# Patient Record
Sex: Male | Born: 1935
Health system: Southern US, Community
[De-identification: ages and names within clinical notes are randomized; demographics above are authoritative.]

## PROBLEM LIST (undated history)

## (undated) DIAGNOSIS — E7800 Pure hypercholesterolemia, unspecified: Secondary | ICD-10-CM

## (undated) DIAGNOSIS — I5031 Acute diastolic (congestive) heart failure: Secondary | ICD-10-CM

## (undated) DIAGNOSIS — I10 Essential (primary) hypertension: Secondary | ICD-10-CM

## (undated) HISTORY — DX: Acute diastolic (congestive) heart failure: I50.31

---

## 1999-05-08 ENCOUNTER — Emergency Department (HOSPITAL_COMMUNITY): Admission: EM | Admit: 1999-05-08 | Discharge: 1999-05-08 | Payer: Self-pay | Admitting: Emergency Medicine

## 1999-05-08 ENCOUNTER — Encounter: Payer: Self-pay | Admitting: Emergency Medicine

## 1999-06-30 ENCOUNTER — Emergency Department (HOSPITAL_COMMUNITY): Admission: EM | Admit: 1999-06-30 | Discharge: 1999-06-30 | Payer: Self-pay | Admitting: Emergency Medicine

## 2001-05-08 ENCOUNTER — Encounter: Admission: RE | Admit: 2001-05-08 | Discharge: 2001-05-08 | Payer: Self-pay | Admitting: Internal Medicine

## 2001-06-26 ENCOUNTER — Emergency Department (HOSPITAL_COMMUNITY): Admission: EM | Admit: 2001-06-26 | Discharge: 2001-06-27 | Payer: Self-pay | Admitting: Emergency Medicine

## 2007-06-30 ENCOUNTER — Emergency Department (HOSPITAL_COMMUNITY): Admission: EM | Admit: 2007-06-30 | Discharge: 2007-06-30 | Payer: Self-pay | Admitting: Emergency Medicine

## 2007-08-30 ENCOUNTER — Emergency Department (HOSPITAL_COMMUNITY): Admission: EM | Admit: 2007-08-30 | Discharge: 2007-08-30 | Payer: Self-pay | Admitting: Emergency Medicine

## 2008-11-13 ENCOUNTER — Encounter: Payer: Self-pay | Admitting: Family Medicine

## 2008-11-13 ENCOUNTER — Ambulatory Visit: Payer: Self-pay | Admitting: Family Medicine

## 2008-11-13 DIAGNOSIS — K219 Gastro-esophageal reflux disease without esophagitis: Secondary | ICD-10-CM

## 2008-11-13 DIAGNOSIS — K5909 Other constipation: Secondary | ICD-10-CM | POA: Insufficient documentation

## 2008-11-13 DIAGNOSIS — E669 Obesity, unspecified: Secondary | ICD-10-CM

## 2008-11-13 DIAGNOSIS — I10 Essential (primary) hypertension: Secondary | ICD-10-CM

## 2008-11-13 HISTORY — DX: Essential (primary) hypertension: I10

## 2008-11-13 LAB — CONVERTED CEMR LAB
ALT: 10 units/L (ref 0–53)
AST: 15 units/L (ref 0–37)
Albumin: 4.4 g/dL (ref 3.5–5.2)
Alkaline Phosphatase: 95 units/L (ref 39–117)
BUN: 12 mg/dL (ref 6–23)
CO2: 25 meq/L (ref 19–32)
Calcium: 8.9 mg/dL (ref 8.4–10.5)
Chloride: 106 meq/L (ref 96–112)
Cholesterol: 204 mg/dL — ABNORMAL HIGH (ref 0–200)
Creatinine, Ser: 1.26 mg/dL (ref 0.40–1.50)
Glucose, Bld: 133 mg/dL — ABNORMAL HIGH (ref 70–99)
HCT: 41.2 % (ref 39.0–52.0)
HDL: 34 mg/dL — ABNORMAL LOW (ref >39)
Hemoglobin: 14.4 g/dL (ref 13.0–17.0)
LDL Cholesterol: 119 mg/dL — ABNORMAL HIGH (ref 0–99)
MCHC: 35 g/dL (ref 30.0–36.0)
MCV: 94.1 fL (ref 78.0–100.0)
Platelets: 221 10*3/uL (ref 150–400)
Potassium: 4.2 meq/L (ref 3.5–5.3)
RBC: 4.38 M/uL (ref 4.22–5.81)
RDW: 12.6 % (ref 11.5–15.5)
Sodium: 142 meq/L (ref 135–145)
Total Bilirubin: 0.6 mg/dL (ref 0.3–1.2)
Total CHOL/HDL Ratio: 6
Total Protein: 7.5 g/dL (ref 6.0–8.3)
Triglycerides: 257 mg/dL — ABNORMAL HIGH (ref <150)
VLDL: 51 mg/dL — ABNORMAL HIGH (ref 0–40)
WBC: 4 10*3/uL (ref 4.0–10.5)

## 2008-11-24 ENCOUNTER — Telehealth: Payer: Self-pay | Admitting: *Deleted

## 2008-11-28 ENCOUNTER — Ambulatory Visit: Payer: Self-pay | Admitting: Family Medicine

## 2008-11-28 DIAGNOSIS — G4700 Insomnia, unspecified: Secondary | ICD-10-CM

## 2008-11-28 DIAGNOSIS — E785 Hyperlipidemia, unspecified: Secondary | ICD-10-CM

## 2008-11-28 HISTORY — DX: Hyperlipidemia, unspecified: E78.5

## 2008-11-28 LAB — CONVERTED CEMR LAB: Hgb A1c MFr Bld: 5.3 %

## 2008-12-03 ENCOUNTER — Ambulatory Visit: Payer: Self-pay | Admitting: Family Medicine

## 2008-12-09 ENCOUNTER — Telehealth: Payer: Self-pay | Admitting: Family Medicine

## 2008-12-11 ENCOUNTER — Encounter: Payer: Self-pay | Admitting: Family Medicine

## 2008-12-16 ENCOUNTER — Ambulatory Visit: Payer: Self-pay | Admitting: Family Medicine

## 2008-12-16 LAB — CONVERTED CEMR LAB
Blood in Urine, dipstick: NEGATIVE
Nitrite: NEGATIVE
Protein, U semiquant: 30
WBC Urine, dipstick: NEGATIVE

## 2008-12-22 ENCOUNTER — Telehealth: Payer: Self-pay | Admitting: Family Medicine

## 2008-12-22 ENCOUNTER — Emergency Department (HOSPITAL_COMMUNITY): Admission: EM | Admit: 2008-12-22 | Discharge: 2008-12-22 | Payer: Self-pay | Admitting: Emergency Medicine

## 2008-12-23 ENCOUNTER — Ambulatory Visit: Payer: Self-pay | Admitting: Family Medicine

## 2009-03-09 ENCOUNTER — Ambulatory Visit: Payer: Self-pay | Admitting: Family Medicine

## 2009-03-09 DIAGNOSIS — N400 Enlarged prostate without lower urinary tract symptoms: Secondary | ICD-10-CM | POA: Insufficient documentation

## 2009-03-09 DIAGNOSIS — N401 Enlarged prostate with lower urinary tract symptoms: Secondary | ICD-10-CM | POA: Insufficient documentation

## 2009-03-09 DIAGNOSIS — R32 Unspecified urinary incontinence: Secondary | ICD-10-CM

## 2009-03-09 HISTORY — DX: Benign prostatic hyperplasia without lower urinary tract symptoms: N40.0

## 2009-03-09 HISTORY — DX: Unspecified urinary incontinence: R32

## 2009-03-09 LAB — CONVERTED CEMR LAB
Bilirubin Urine: NEGATIVE
Blood in Urine, dipstick: NEGATIVE
Glucose, Urine, Semiquant: NEGATIVE
Ketones, urine, test strip: NEGATIVE
Nitrite: NEGATIVE
Protein, U semiquant: NEGATIVE
Specific Gravity, Urine: 1.025
Urobilinogen, UA: 4
WBC Urine, dipstick: NEGATIVE
pH: 6

## 2009-05-20 IMAGING — CR DG CHEST 1V PORT
1 series · 1 of 1 positions shown · non-contrast
Comparison: None

CLINICAL DATA: Chest pain

PORTABLE CHEST - 1 VIEW

[AP]
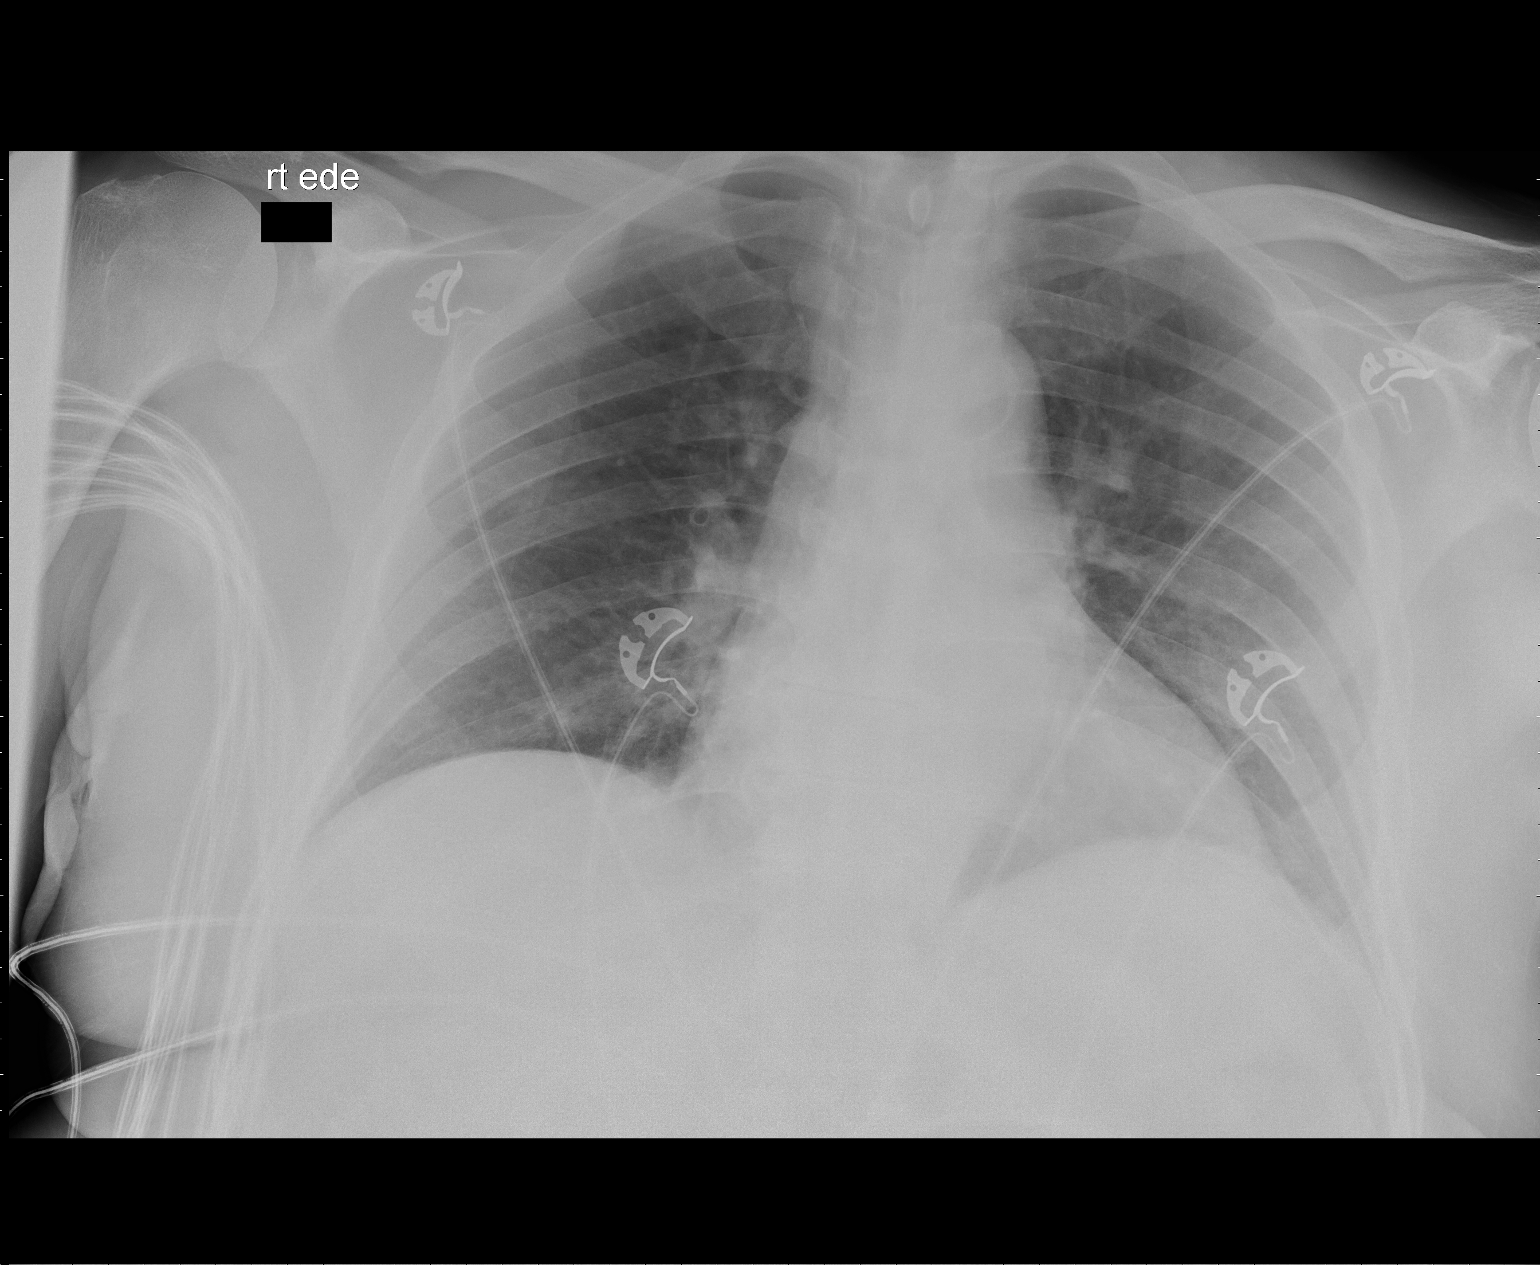

[1 of 1 positions shown; findings below may reference images not displayed]

FINDINGS: Heart size within normal limits considering AP
projection.  No congestive heart failure or pneumonia in one-view.
There is mild peribronchial thickening.
IMPRESSION: No active disease in one-view.

## 2009-07-17 ENCOUNTER — Emergency Department (HOSPITAL_COMMUNITY): Admission: EM | Admit: 2009-07-17 | Discharge: 2009-07-17 | Payer: Self-pay | Admitting: Emergency Medicine

## 2010-02-16 ENCOUNTER — Telehealth: Payer: Self-pay | Admitting: Family Medicine

## 2010-02-16 ENCOUNTER — Encounter: Admission: RE | Admit: 2010-02-16 | Discharge: 2010-02-16 | Payer: Self-pay | Admitting: Family Medicine

## 2010-02-16 ENCOUNTER — Ambulatory Visit: Payer: Self-pay | Admitting: Family Medicine

## 2010-02-16 DIAGNOSIS — M25579 Pain in unspecified ankle and joints of unspecified foot: Secondary | ICD-10-CM | POA: Insufficient documentation

## 2010-02-17 ENCOUNTER — Telehealth: Payer: Self-pay | Admitting: Family Medicine

## 2010-08-05 ENCOUNTER — Encounter: Payer: Self-pay | Admitting: *Deleted

## 2010-08-17 ENCOUNTER — Ambulatory Visit: Admission: RE | Admit: 2010-08-17 | Discharge: 2010-08-17 | Payer: Self-pay | Source: Home / Self Care

## 2010-08-17 ENCOUNTER — Encounter: Payer: Self-pay | Admitting: Family Medicine

## 2010-08-17 ENCOUNTER — Encounter
Admission: RE | Admit: 2010-08-17 | Discharge: 2010-08-17 | Payer: Self-pay | Source: Home / Self Care | Attending: Family Medicine | Admitting: Family Medicine

## 2010-08-17 DIAGNOSIS — M25559 Pain in unspecified hip: Secondary | ICD-10-CM | POA: Insufficient documentation

## 2010-08-17 DIAGNOSIS — M889 Osteitis deformans of unspecified bone: Secondary | ICD-10-CM | POA: Insufficient documentation

## 2010-08-17 LAB — CONVERTED CEMR LAB
ALT: 8 units/L (ref 0–53)
AST: 15 units/L (ref 0–37)
Albumin: 4.7 g/dL (ref 3.5–5.2)
Alkaline Phosphatase: 102 units/L (ref 39–117)
BUN: 15 mg/dL (ref 6–23)
CO2: 26 meq/L (ref 19–32)
Calcium: 9.6 mg/dL (ref 8.4–10.5)
Chloride: 105 meq/L (ref 96–112)
Creatinine, Ser: 1.23 mg/dL (ref 0.40–1.50)
Glucose, Bld: 89 mg/dL (ref 70–99)
Potassium: 4.3 meq/L (ref 3.5–5.3)
Sodium: 145 meq/L (ref 135–145)
Total Bilirubin: 0.7 mg/dL (ref 0.3–1.2)
Total Protein: 7.9 g/dL (ref 6.0–8.3)

## 2010-08-20 ENCOUNTER — Encounter: Payer: Self-pay | Admitting: *Deleted

## 2010-08-27 ENCOUNTER — Encounter (HOSPITAL_COMMUNITY)
Admission: RE | Admit: 2010-08-27 | Discharge: 2010-09-14 | Payer: Self-pay | Source: Home / Self Care | Attending: Family Medicine | Admitting: Family Medicine

## 2010-09-07 ENCOUNTER — Encounter: Payer: Self-pay | Admitting: Family Medicine

## 2010-09-07 ENCOUNTER — Telehealth: Payer: Self-pay | Admitting: Family Medicine

## 2010-09-16 NOTE — Letter (Signed)
Summary: Generic Letter  [facility]  [address]   [place], [place] [address]   Phone: [phone]  Fax: [phone]    08/20/2010  Paul [name] [address] [place], [place]  [address]  Dear [name],  I have scheduled your bone scan that Dr Earlene Plater discussed with you at your last visit. It will be Friday August 27, 2010 at 11:00am at [facility]. If you have any questions please feel free to call the office.  Also I was unable to reach you by phone, the number I have has been disconnected. If you could please call our office and give [place] an updated phone number.    Sincerely,   Rayden Dock CMA

## 2010-09-16 NOTE — Progress Notes (Signed)
Summary: pls call  Phone Note Call from Patient Call back at Home Phone [phone]   Caller: Patient Summary of Call: pt would like to talk to Dr Earlene Plater asap  Initial call taken by: De Nurse,  December 09, 2008 1:36 PM  Follow-up for Phone Call        he wanted to know what std his wife had. told him I do not discuss anyone's records with anyone but the pt. I asked him to put his wife on the phone. told her to talk with her husband, as I cannot. she had no questions about her illness or meds Follow-up by: Golden Circle RN,  December 09, 2008 2:01 PM

## 2010-09-16 NOTE — Assessment & Plan Note (Signed)
Summary: incontinence,df   Vital Signs:  Patient profile:   77 year old male Weight:      172.6 pounds Temp:     98.1 degrees F oral Pulse rate:   60 / minute BP sitting:   150 / 79  (right arm)  Vitals Entered By: Arlyss Repress CMA, (March 09, 2009 1:55 PM) CC: urinary incontinence getting worse. Is Patient Diabetic? No Pain Assessment Patient in pain? no        Primary Care Provider:  Helane Rima DO  CC:  urinary incontinence getting worse.Marland Kitchen  History of Present Illness: Paul [name] is a 77 year old male with a PMHx of HTN and BPH presenting for worsening urinary incontinence.   1. Urinary Incontinence: He was seen in April 2010 for a physical and found to have enlarged prostate on exam with no nodules noted. At that time, he c/o urinary frequency during the day, x 2 at night, trouble with begining stream, and small stream. Flomax was initiated. Since then, he has not noticed much of a difference; only easier stream. Today, he c/o urinary incontinence x 3-4 years. He states that he did not bring it up before now because he was embarrased. He has urge incontinence with sound/sight/thought of water and when he is nervous/excited, and is usually able to make it to the bathroom, but does sometimes have "leaking accidents." He does drink water throughout the day, he has no PMHx of DM, no evidence of UTI, no bowel incontinence, on no diuretics.  2. Dyspepsia: sour taste in mouth, some heartburn, taking Prilosec OTC with relief, just wanted me to know.  Current Medications (verified): 1)  Lisinopril 20 Mg Tabs (Lisinopril) .Marland Kitchen.. 1 Tablet By Mouth Daily 2)  Flomax 0.4 Mg Cp24 (Tamsulosin Hcl) .... Take 1 Capsule By Mouth Nightly 3)  Trazodone Hcl 50 Mg Tabs (Trazodone Hcl) .... Take 1/2 To One Tab At [facility] As Needed Sleep 4)  Triamcinolone Acetonide 0.1 % Crea (Triamcinolone Acetonide) .... Apply To Affected Area Bid 5)  Metronidazole 500 Mg Tabs (Metronidazole) .... Take 4 Tabs At One  Time 6)  Finasteride 5 Mg Tabs (Finasteride) .Marland Kitchen.. 1 By Mouth Daily  Allergies (verified): No Known Drug Allergies  Past History:  Past Medical History: Herpes Zoster 2008 Seasonal Allergies GERD BPH Constipation Urinary Incontinence, Urge  Review of Systems       per HPI, otherwise negative  Physical Exam  General:  Well-developed,well-nourished, in no acute distress; alert, appropriate and cooperative throughout examination. vital signs reviewed. Lungs:  Normal respiratory effort, chest expands symmetrically. Lungs are clear to auscultation, no crackles or wheezes. Heart:  Normal rate and regular rhythm. S1 and S2 normal without gallop, murmur, click, rub or other extra sounds. Abdomen:  Bowel sounds positive,abdomen soft and non-tender without masses, organomegaly or hernias noted. Genitalia:  deferred, done in April Prostate:  deferred, done in April Pulses:  R and L dorsalis pedis and posterior tibial pulses are full and equal bilaterally   Impression & Recommendations:  Problem # 1:  URINARY INCONTINENCE (ICD-788.30) Assessment New Urge type incontinence in an elderly male already on Flomax. He has had only mild improvement in his BPH symptoms. Will add Finasteride to see if this improves his BPH s/s over 3-6 months with the plan to start Detrol or similar medication if urge incontinence persists. Will send to Urology for evaluation and management recommendations. Patient voiced understanding and agreement with plan. Orders: [facility]- Est  Level 4 (04540)  Problem # 2:  BENIGN PROSTATIC HYPERTROPHY, WITH URINARY OBSTRUCTION (ICD-600.01) Assessment: Improved See #1 Orders: [facility]- Est  Level 4 (54098)  Problem # 3:  ESSENTIAL HYPERTENSION, BENIGN (ICD-401.1) Assessment: Unchanged Will not increase Lisinopril at this time since we are adding Finasteride.   His updated medication list for this problem includes:    Lisinopril 20 Mg Tabs (Lisinopril) .Marland Kitchen... 1 tablet by mouth  daily  Orders: [facility]- Est  Level 4 (11914)  Problem # 4:  GASTROESOPHAGEAL REFLUX DISEASE, MILD (ICD-530.81) Assessment: Improved Continue current treatment. Educated patient re: prevention and RED FLAGS.  Orders: [facility]- Est  Level 4 (78295)  Complete Medication List: 1)  Lisinopril 20 Mg Tabs (Lisinopril) .Marland Kitchen.. 1 tablet by mouth daily 2)  Flomax 0.4 Mg Cp24 (Tamsulosin hcl) .... Take 1 capsule by mouth nightly 3)  Trazodone Hcl 50 Mg Tabs (Trazodone hcl) .... Take 1/2 to one tab at hs as needed sleep 4)  Triamcinolone Acetonide 0.1 % Crea (Triamcinolone acetonide) .... Apply to affected area bid 5)  Metronidazole 500 Mg Tabs (Metronidazole) .... Take 4 tabs at one time 6)  Finasteride 5 Mg Tabs (Finasteride) .Marland Kitchen.. 1 by mouth daily  Other Orders: Urinalysis-[facility] (00000) Urology Referral (Urology)  Patient Instructions: 1)  It was very nice to see you today! Follow up in 2-3 months. 2)  I am prescribing another medication to help with urine leakage that may be from your prostate being enlarged. 3)  I would like to send you to the urologist for evaluation. 4)  I will not change your blood pressure medicine because the Finasteride should decrease it a little. Prescriptions: FINASTERIDE 5 MG TABS (FINASTERIDE) 1 by mouth daily  #90 x 3   Entered and Authorized by:   Helane Rima MD   Signed by:   Helane Rima MD on 03/09/2009   Method used:   Print then Give to Patient   RxID:   [id number]   Laboratory Results   Urine Tests  Date/Time Received: March 09, 2009 1:59 PM  Date/Time Reported: March 09, 2009 2:07 PM   Routine Urinalysis   Color: dark yellow Appearance: Clear Glucose: negative   (Normal Range: Negative) Bilirubin: negative   (Normal Range: Negative) Ketone: negative   (Normal Range: Negative) Spec. Gravity: 1.025   (Normal Range: 1.003-1.035) Blood: negative   (Normal Range: Negative) pH: 6.0   (Normal Range: 5.0-8.0) Protein: negative   (Normal Range:  Negative) Urobilinogen: 4.0   (Normal Range: 0-1) Nitrite: negative   (Normal Range: Negative) Leukocyte Esterace: negative   (Normal Range: Negative)    Comments: ...............test performed by......Marland KitchenBonnie A. [place], [place] (ASCP)

## 2010-09-16 NOTE — Consult Note (Signed)
[facility]   Imported By: De Nurse 12/16/2008 15:22:46  _____________________________________________________________________  External Attachment:    Type:   Image     Comment:   External Document

## 2010-09-16 NOTE — Assessment & Plan Note (Signed)
Summary: NP,df   Vital Signs:  Patient profile:   77 year old male Height:      64 inches Weight:      175.13 pounds BMI:     30.17 Temp:     97.1 degrees F oral Pulse rate:   66 / minute BP sitting:   167 / 92  (left arm)  Vitals Entered By: deseree blount/sma  Serial Vital Signs/Assessments:  Time      Position  BP       Pulse  Resp  Temp     By                     145/85                         Helane Rima MD  Comments: Taken manually, patient seated, regular cuff, by Dr. Earlene Plater By: Helane Rima MD   CC: new patient visit Is Patient Diabetic? No Pain Assessment Patient in pain? no        History of Present Illness: Paul [name] is a pleasant AA male presenting for new patient visit.  1. BP: today, elevated (1) 167/92 and (2) 145/85. Never on medication for this, no known history of HTN. Denies headache, vision changes, chest pain,  lower extremity edema. +SOB with exertion.  2. "Teeth Problems": states that he has "soft teeth," needs dentures, having trouble finding dentist.  3. Constipation: happens often, eats lots of greens, has to strain to have BM, denies rectal bleeding or pain.  4. GERD: treats with OTC medications, stable at this time.  5. BPH: has been told that he has en enlarged prostate in the past, + frequency during the day, x 2 at hs, trouble with begining stream, small stream.  6. Resp: ? Hx of TB per patient. will request records. at [facility]?  7. Health Maintenance: patient's last physical was in the 1960s, never had colonoscopy, "never" had bloodwork done for cholesterol, etc, no fluvax, unknown last tetanus vaccine, no pneumovax   Habits & Providers     Alcohol drinks/day: 0     Alcohol Counseling: not indicated; patient does not drink     Tobacco Status: never     Tobacco Counseling: not indicated; no tobacco use     STD Risk: past     Drug Use: past MJ use  Current Medications (verified): 1)  Lisinopril 10 Mg  Tabs (Lisinopril)  .... Take 1 Tab By Mouth Daily  Allergies (verified): No Known Drug Allergies  Past History:  Past Medical History:    Herpes Zoster 2008    Seasonal Allergies    GERD    BPH    Constipation  Past Surgical History:    "Boils under arms"  Family History:    Father died age 46 of unknown cancer "he had a bump on his back."    Mother died age 75 of unkown heart "complications," HTN, insulin dep DM  Social History:    Lives with wife, Paul [name]. Denies tobacco, ETOH, drugs. Wife with PMHx cocaine and ETOH abuse, Hepatitis.     Smoking Status:  never    STD Risk:  past    Drug Use:  past MJ use  Physical Exam  General:  alert, well-developed, well-nourished, well-hydrated, good hygiene, and overweight-appearing.  vital signs reviewed. Eyes:  cloudy ring around iris bilaterally. Mouth:  pharynx pink and moist, poor dentition, and teeth missing.  Lungs:  Normal respiratory effort, chest expands symmetrically. Lungs are clear to auscultation, no crackles or wheezes. Heart:  Normal rate and regular rhythm. S1 and S2 normal without gallop, murmur, click, rub or other extra sounds. Abdomen:  Bowel sounds positive,abdomen soft and non-tender without masses, organomegaly or hernias noted. Pulses:  R and L carotid, radial, dorsalis pedis and posterior tibial pulses are full and equal bilaterally Psych:  Cognition and judgment appear intact. Alert and cooperative with normal attention span and concentration. No apparent delusions, illusions, hallucinations   Impression & Recommendations:  Problem # 1:  ESSENTIAL HYPERTENSION, BENIGN (ICD-401.1) Assessment New Will begin low dose Lisinopril. Discussed advantages of decreased salt in diet and exercise in controlling this chronic disease. Will f/u one week for physical and re-eval.  His updated medication list for this problem includes:    Lisinopril 10 Mg Tabs (Lisinopril) .Marland Kitchen... Take 1 tab by mouth daily  Orders: Comp Met-[facility]  (04540-98119) CBC-[facility] (14782) Lipid-[facility] (95621-30865)  Problem # 2:  GASTROESOPHAGEAL REFLUX DISEASE, MILD (ICD-530.81) Assessment: Unchanged Mild. Stable. Continue OTC medications.   Problem # 3:  CONSTIPATION, CHRONIC (ICD-564.09) Assessment: Unchanged Discussed high fiber diet, Miralax. Will schedule screening colonoscopy.  His updated medication list for this problem includes:    Miralax Powd (Polyethylene glycol 3350) .Marland KitchenMarland KitchenMarland KitchenMarland Kitchen 17g by mouth once daily as needed constipation  Orders: Colonoscopy (Colon)  Problem # 4:  OBESITY (ICD-278.00) Assessment: Unchanged Discussed the benefits of a healthy diet and exercise.  Problem # 5:  Preventive Health Care (ICD-V70.0) Assessment: Unchanged Will have patient follow up in 1-2 weeks for full physical including rectal exam. Labs ordered and pending. Will refer for colonoscopy. Advised eye exam.  Problem # 6:  BENIGN PROSTATIC HYPERTROPHY, HX OF (ICD-V13.8) Assessment: Unchanged Full physical in one week, will include rectal exam. Will consider treating with medication.  Complete Medication List: 1)  Lisinopril 10 Mg Tabs (Lisinopril) .... Take 1 tab by mouth daily 2)  Miralax Powd (Polyethylene glycol 3350) .Marland Kitchen.. 17g by mouth once daily as needed constipation  Patient Instructions: 1)  It was a pleasure meeting you today! 2)  Please schedule a physical for 1-2 weeks.  3)  Increasing fiber in your diet may reduce or eliminate constipation. The recommended amount of dietary fiber is 20 to 35 grams of fiber per day. By reading the product information panel on the side of the package, you can determine the number of grams of fiber per serving. Many fruits and vegetables can be particularly helpful in preventing and treating constipation (table 2A-C). This is especially true of citrus fruits, prunes, and prune juice. Some breakfast cereals are also an excellent source of dietary fiber. 4)  Bulk forming laxatives - These include natural fiber  and commercial fiber preparations such as: 5)      * Psyllium (Konsyl; Metamucil; Perdiem) 6)      * Methylcellulose (Citrucel) 7)      * Calcium polycarbophil (FiberCon; Fiber-Lax; Mitrolan). 8)      * Wheat dextrin (Benefiber) 9)  You should increase the dose of fiber supplements slowly to prevent gas and cramping, and you should always take the supplement with plenty of fluid. 10)  Schedule a colonoscopy/ sigmoidoscopy to help detect colon cancer.  Prescriptions: LISINOPRIL 10 MG  TABS (LISINOPRIL) Take 1 tab by mouth daily  #30 x 0   Entered and Authorized by:   Helane Rima MD   Signed by:   Helane Rima MD on 11/13/2008   Method used:  Print then Give to Patient   RxID:   [id number]
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
## 2010-09-16 NOTE — Progress Notes (Signed)
  Phone Note Outgoing Call Call back at [facility] Phone [phone]   Call placed by: Ellery Plunk MD,  February 17, 2010 9:05 PM Summary of Call: spoke with pt to let him know that xrays normal.  leg not broken Initial call taken by: Ellery Plunk MD,  February 17, 2010 9:06 PM

## 2010-09-16 NOTE — Assessment & Plan Note (Signed)
Summary: poison ivy,tcb   Vital Signs:  Patient profile:   77 year old male Height:      63 inches Weight:      181.9 pounds BMI:     32.34 Pulse rate:   72 / minute BP sitting:   186 / 84  (right arm)  Vitals Entered By: Theresia Lo RN (December 03, 2008 1:35 PM) CC: poison ivy Is Patient Diabetic? No Pain Assessment Patient in pain? no        History of Present Illness: Paul [name] is a 77 year old AA male presenting for poison ivy on left arm. States that he was working in the yard yesterday and later, noticed that his left arm was itchy with a rash that looked like previous poison ivy.   Habits & Providers     Tobacco Status: never  Allergies (verified): No Known Drug Allergies  Review of Systems MS:  Denies joint pain, joint redness, joint swelling, loss of strength, and stiffness. Derm:  Complains of itching and rash. Allergy:  Complains of hives or rash and seasonal allergies; denies itching eyes, persistent infections, and sneezing.  Physical Exam  General:  alert, well-developed, well-nourished, well-hydrated, good hygiene, and overweight-appearing.  vital signs reviewed. Skin:  left arm rash- red, raised, no blisters, pruritic per patient, no open lesions, no streaks.   Impression & Recommendations:  Problem # 1:  CONTACT DERMATITIS-NOS (ICD-692.9) Assessment New  Rash consistent with poison ivy. Discussed natural progression of rash (blisters), future prevention, and treatment. His updated medication list for this problem includes:    Triamcinolone Acetonide 0.1 % Crea (Triamcinolone acetonide) .Marland Kitchen... Apply to affected area bid  Orders: [facility]- Est Level  3 (16109)  Problem # 2:  ESSENTIAL HYPERTENSION, BENIGN (ICD-401.1) Assessment: Deteriorated  BP elevated today. Will not adjust medicaton as patient is in distress from rash. Advised to follow up in 1-2 weeks for recheck. His updated medication list for this problem includes:    Lisinopril 20 Mg Tabs  (Lisinopril) .Marland Kitchen... 1 tablet by mouth daily  Orders: [facility]- Est Level  3 (60454)  Complete Medication List: 1)  Lisinopril 20 Mg Tabs (Lisinopril) .Marland Kitchen.. 1 tablet by mouth daily 2)  Flomax 0.4 Mg Cp24 (Tamsulosin hcl) .... Take 1 capsule by mouth nightly 3)  Trazodone Hcl 50 Mg Tabs (Trazodone hcl) .... Take 1/2 to one tab at hs as needed sleep 4)  Triamcinolone Acetonide 0.1 % Crea (Triamcinolone acetonide) .... Apply to affected area bid  Patient Instructions: 1)  "Leaves of three, let them be" is a phrase often used to identify plants that cause poison ivy dermatitis.  2)  Poison ivy dermatitis usually resolves within one to three weeks without treatment. Treatments that may help relieve the itching, soreness, and discomfort caused by poison ivy dermatitis include:  3)  -Adding oatmeal to a bath, applying cool wet compresses, and applying calamine lotion may help to relieve itching.  4)  -Once the blisters begin weeping fluid, astringents containing aluminum acetate (Burrow's solution) and Domeboro may help to relieve the rash.  5)  -Antihistamines that make you sleepy (eg, diphenhydramine [Benadryl]) may be helpful if you have trouble sleeping due to itching. 6)  Apply this steroid cream to affected area twice daily as needed for rash. Don't use on your face.  Prescriptions: TRIAMCINOLONE ACETONIDE 0.1 % CREA (TRIAMCINOLONE ACETONIDE) apply to affected area bid  #1 tube x 0   Entered and Authorized by:   Helane Rima MD   Signed  by:   Helane Rima MD on 12/03/2008   Method used:   Print then Give to Patient   RxID:   [id number]
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
## 2010-09-16 NOTE — Letter (Signed)
Summary: Generic Letter  [facility]  [address]   [place], [place] [address]   Phone: [phone]  Fax: [phone]    09/07/2010  LEOPOLD SMYERS [address] [place], [place]  [address]  Dear Mr. COLT,  I have been unable to reach you by phone to discuss the results of your bone scan. Please make an appointment to discuss the scan and treatment for your pain.  Sincerely,   Helane Rima DO  Appended Document: Generic Letter mailed

## 2010-09-16 NOTE — Progress Notes (Signed)
Summary: results  Phone Note Call from Patient Call back at [phone]   Caller: Patient Summary of Call: would like results of xray Initial call taken by: De Nurse,  September 07, 2010 10:08 AM  Follow-up for Phone Call        called patient and unable to leave message. will send letter. Follow-up by: Helane Rima DO,  September 07, 2010 12:16 PM
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
## 2010-09-17 ENCOUNTER — Ambulatory Visit (INDEPENDENT_AMBULATORY_CARE_PROVIDER_SITE_OTHER): Payer: PRIVATE HEALTH INSURANCE | Admitting: Family Medicine

## 2010-09-17 ENCOUNTER — Encounter: Payer: Self-pay | Admitting: Family Medicine

## 2010-09-17 DIAGNOSIS — M889 Osteitis deformans of unspecified bone: Secondary | ICD-10-CM

## 2010-09-17 DIAGNOSIS — M25559 Pain in unspecified hip: Secondary | ICD-10-CM

## 2010-09-20 ENCOUNTER — Encounter: Payer: Self-pay | Admitting: *Deleted

## 2010-09-20 ENCOUNTER — Encounter: Payer: Self-pay | Admitting: Family Medicine

## 2010-09-21 ENCOUNTER — Telehealth: Payer: Self-pay | Admitting: Family Medicine

## 2010-09-30 NOTE — Progress Notes (Signed)
Summary: Rx  Phone Note Call from Patient Call back at [phone]   Summary of Call: pt received our letter, he would like his meds sent to [facility] on [address].  Initial call taken by: Knox Royalty,  September 21, 2010 11:21 AM  Follow-up for Phone Call        will forward to Dr. Earlene Plater. Follow-up by: Theresia Lo RN,  September 21, 2010 11:35 AM    Prescriptions: CALCIUM CARBONATE 600 MG TABS (CALCIUM CARBONATE) one by mouth BID  #60 x 3   Entered and Authorized by:   Helane Rima DO   Signed by:   Helane Rima DO on 09/21/2010   Method used:   Electronically to        [facility] [address] #[id number]* (retail)       [address] [place], [place]  [address]       Ph: [phone]       Fax: [phone]   RxID:   [id number] VITAMIN D 1000 UNIT TABS (CHOLECALCIFEROL) one by mouth daily  #30 x 3   Entered and Authorized by:   Helane Rima DO   Signed by:   Helane Rima DO on 09/21/2010   Method used:   Electronically to        [facility] [address] #[id number]* (retail)       [address] [place], [place]  [address]       Ph: [phone]       Fax: [phone]   RxID:   [id number] FOSAMAX 40 MG TABS (ALENDRONATE SODIUM) one by mouth daily - FOR PAGET'S DISEASE  #30 x 0   Entered and Authorized by:   Helane Rima DO   Signed by:   Helane Rima DO on 09/21/2010   Method used:   Electronically to        [facility] [address] #[id number]* (retail)       [address] [place], [place]  [address]       Ph: [phone]       Fax: [phone]   RxID:   [id number]   Appended Document: Orders Update    Clinical Lists Changes  Medications: Changed medication from CALCIUM CARBONATE 600 MG TABS (CALCIUM CARBONATE) one by mouth BID to ANTACID 500 MG CHEW (CALCIUM CARBONATE ANTACID) one by mouth two times a day - Signed Rx of ANTACID 500 MG CHEW (CALCIUM CARBONATE ANTACID) one by mouth two times a day;  #60 x 3;  Signed;  Entered by:  Helane Rima DO;  Authorized by: Helane Rima DO;  Method used: Electronically to [facility] #[id number]*, [address], [place], [place]  [address], Ph: [phone], Fax: [phone]    Prescriptions: ANTACID 500 MG CHEW (CALCIUM CARBONATE ANTACID) one by mouth two times a day  #60 x 3   Entered and Authorized by:   Helane Rima DO   Signed by:   Helane Rima DO on 09/21/2010   Method used:   Electronically to        [facility] [address] #[id number]* (retail)       [address] [place], [place]  [address]       Ph: [phone]       Fax: [phone]   RxID:   [id number]

## 2010-09-30 NOTE — Letter (Signed)
Summary: Generic Letter  [facility]  [address]   [place], [place] [address]   Phone: [phone]  Fax: [phone]    09/20/2010  Paul [name] [address] [place], [place]  [address]  [place]  Dear [name],  Your recent labs were normal. I would like for you to start Fosamax daily to strengthen your bones. I would also like for you to take 1200mg  of calcium and 1000 international units of vitamin D daily. Please call the clinic so that we can send these prescriptions to your preferred pharmacy.  Sincerely,   Helane Rima DO

## 2010-09-30 NOTE — Assessment & Plan Note (Signed)
Summary: f/u xray results/[facility]c   Vital Signs:  Patient profile:   77 year old male Height:      63 inches Weight:      171 pounds BMI:     30.40 Temp:     97.5 degrees F oral Pulse rate:   81 / minute BP sitting:   144 / 74  (left arm) Cuff size:   regular  Vitals Entered By: Tessie Fass CMA (September 17, 2010 10:36 AM) CC: F/U imaging, paget's disease Is Patient Diabetic? No Pain Assessment Patient in pain? no        Primary Care Provider:  Helane Rima DO  CC:  F/U imaging and paget's disease.  History of Present Illness: 77 yo M:  1. Paget's Disease: Right proximal to mid right femoral. Hip Pain: Right side, worse when he first moves from a lying/sitting position and gets better as he "warms up." Pain is focal but "deep." CMP with normal alk phos. Tylenol with as needed Ultram controlling pain. No new s/s.   Habits & Providers  Alcohol-Tobacco-Diet     Tobacco Status: never  Current Medications (verified): 1)  Lisinopril 20 Mg Tabs (Lisinopril) .Marland Kitchen.. 1 Tablet By Mouth Daily 2)  Tramadol Hcl 50 Mg  Tabs (Tramadol Hcl) .... One By Mouth Up To Three Times A Day As Needed For Pain 3)  Tylenol Arthritis Pain 650 Mg Cr-Tabs (Acetaminophen) .... One By Mouth Tid  Allergies (verified): No Known Drug Allergies  Past History:  Past Medical History: Herpes Zoster 2008 Seasonal Allergies GERD BPH Constipation Urinary Incontinence, Urge Paget's Disease    - proximal to mid right femoral HTN PMH-FH-SH reviewed for relevance  Review of Systems General:  Denies chills, fever, weakness, and weight loss. MS:  Complains of joint pain; denies joint redness, joint swelling, and loss of strength.  Physical Exam  General:  Well-developed, well-nourished, in no acute distress; alert, appropriate and cooperative throughout examination. Vitals reviewed. Msk:  Favoring right leg. Normal low back exam with FROM, negative SLR bilaterally. Right hip TTP along joint line -  "deep inside" - + pain with internal and external rotation of hip. Pulses:  2+ DP. Extremities:  No edema. Neurologic:  Strength normal in all extremities, sensation intact to light touch, and DTRs symmetrical and normal.     Impression & Recommendations:  Problem # 1:  PAGET'S DISEASE (ICD-731.0) Assessment New Discussed Dx and Tx. Will check alk phos, phos, and vitamin D before initiating bisphosphonate. Orders: Basic Met-[facility] [phone]) Vit D, 25 OH-[facility] [phone]) Phosphorus-[facility] [phone]) [facility]- Est Level  3 (13244)  Problem # 2:  HIP PAIN, RIGHT (ICD-719.45) Assessment: Unchanged Controlled with current regimen. Denies over-sedation. His updated medication list for this problem includes:    Tramadol Hcl 50 Mg Tabs (Tramadol hcl) ..... One by mouth up to three times a day as needed for pain    Tylenol Arthritis Pain 650 Mg Cr-tabs (Acetaminophen) ..... One by mouth tid  Orders: [facility]- Est Level  3 (01027)  Complete Medication List: 1)  Lisinopril 20 Mg Tabs (Lisinopril) .Marland Kitchen.. 1 tablet by mouth daily 2)  Tramadol Hcl 50 Mg Tabs (Tramadol hcl) .... One by mouth up to three times a day as needed for pain 3)  Tylenol Arthritis Pain 650 Mg Cr-tabs (Acetaminophen) .... One by mouth tid  Patient Instructions: 1)  It was nice to see you today! 2)  We are checking labs.   Orders Added: 1)  Basic Met-[facility] [[id number]] 2)  Vit  D, 25 OH[facility] [phone] 3)  Phosphorus-[facility] [[id number]] 4)  [facility]- Est Level  3 [14782]

## 2010-11-23 LAB — POCT I-STAT, CHEM 8
BUN: 10 mg/dL (ref 6–23)
Calcium, Ion: 1.05 mmol/L — ABNORMAL LOW (ref 1.12–1.32)
Chloride: 107 meq/L (ref 96–112)
Creatinine, Ser: 1.1 mg/dL (ref 0.4–1.5)
Glucose, Bld: 93 mg/dL (ref 70–99)
HCT: 42 % (ref 39.0–52.0)
Hemoglobin: 14.3 g/dL (ref 13.0–17.0)
Potassium: 3.9 meq/L (ref 3.5–5.1)
Sodium: 140 meq/L (ref 135–145)
TCO2: 23 mmol/L (ref 0–100)

## 2010-11-23 LAB — POCT CARDIAC MARKERS
CKMB, poc: <1 ng/mL — ABNORMAL LOW (ref 1.0–8.0)
Myoglobin, poc: 65 ng/mL (ref 12–200)
Troponin i, poc: <0.05 ng/mL (ref 0.00–0.09)

## 2010-12-10 ENCOUNTER — Inpatient Hospital Stay (INDEPENDENT_AMBULATORY_CARE_PROVIDER_SITE_OTHER)
Admission: RE | Admit: 2010-12-10 | Discharge: 2010-12-10 | Disposition: A | Discharge status: Home / Self Care | Payer: PRIVATE HEALTH INSURANCE | Source: Ambulatory Visit | Attending: Family Medicine | Admitting: Family Medicine

## 2010-12-10 DIAGNOSIS — T1500XA Foreign body in cornea, unspecified eye, initial encounter: Secondary | ICD-10-CM

## 2012-02-14 ENCOUNTER — Ambulatory Visit (INDEPENDENT_AMBULATORY_CARE_PROVIDER_SITE_OTHER): Payer: PRIVATE HEALTH INSURANCE | Admitting: Family Medicine

## 2012-02-14 ENCOUNTER — Encounter: Payer: Self-pay | Admitting: Family Medicine

## 2012-02-14 VITALS — BP 165/81 | HR 59 | Ht 63.0 in | Wt 179.2 lb

## 2012-02-14 DIAGNOSIS — N529 Male erectile dysfunction, unspecified: Secondary | ICD-10-CM | POA: Insufficient documentation

## 2012-02-14 DIAGNOSIS — R109 Unspecified abdominal pain: Secondary | ICD-10-CM

## 2012-02-14 DIAGNOSIS — E785 Hyperlipidemia, unspecified: Secondary | ICD-10-CM

## 2012-02-14 DIAGNOSIS — I10 Essential (primary) hypertension: Secondary | ICD-10-CM

## 2012-02-14 DIAGNOSIS — M889 Osteitis deformans of unspecified bone: Secondary | ICD-10-CM

## 2012-02-14 MED ORDER — HYDROCHLOROTHIAZIDE 25 MG PO TABS: 25.0000 mg | ORAL_TABLET | Freq: Every day | ORAL | Status: DC

## 2012-02-14 MED ORDER — TADALAFIL 20 MG PO TABS: 10.0000 mg | ORAL_TABLET | ORAL | Status: DC | PRN

## 2012-02-14 NOTE — Assessment & Plan Note (Signed)
Will prescribe cialis. Patient denies chest pain. Upon review of records, no history of heart disease. Patient warned of side effects.

## 2012-02-14 NOTE — Patient Instructions (Addendum)
For the pain at your side, I think it is most likely due to your muscles on that side when you lay down. If it bothers you a lot, you can take 1-2 tabs of ibuprofen to help with it. If it gets any worst, please come back to the clinic.  We are going to check some blood work. Please make a lab appointment to get your lab drawn. You need to not eat or drink anything besides water after midnight the night before.  I am also prescribing a blood pressure medicine which is very important for you to take.  Please return in a month to see how your blood pressure is doing. If you have a way to check your blood pressure at home, please keep a log of how your blood pressures have been doing.   For the cialis, if you start having acute hearing loss, if you start having sudden chest pain, vision loss or feelings of dizziness, seek medical attention immediately.

## 2012-02-14 NOTE — Progress Notes (Signed)
Patient ID: Paul [name], male   DOB: 1927-07-21, 78 y.o.   MRN: [id number] Patient ID: Paul [name]    DOB: [birth year], 78 y.o.   MRN: [id number] --- Subjective:  Paul [name] is a 78 y.o.male who presents with back pain and trouble with erection. 1. Back pain:  Location: left side flank Onset: insiduous Time period of: "years ago" Severity: nagging, not acute pain. Course of symptoms over time: stable, not worst in time Aggravating: laying flat in bed Alleviating: walking, getting out of bed.  Associated sx/sn: no bowel or urinary incontinence, no weakness, no recent weight loss  2. Hypertension:  Has not been taking his lisinopril because he reports it affects his sex drive. He denies any headaches, change in vision, lower extremity edema. Doesn't check BP at home  3. Erectile dysfunction: Is not able to have erection, which is disturbing to him especially since his wife is 20 years younger.   4. Hearing loss: feels like he cannot hear as well in both ears.  5. History of paget's disease: patient's hip pain which prompted the diagnosis by bone scan, is no longer bothering him and he stopped taking cholecalciferol and fosamax.  ROS: see HPI Past Medical History: reviewed and updated medications and allergies. Social History: Tobacco: denies  Objective: Filed Vitals:   02/14/12 0932  BP: 165/81  Pulse: 59    Physical Examination:   General appearance - alert, well appearing, appears younger than stated age, and in no distress Ears - left TM normal, cerumen in right ear Nose - normal and patent, no erythema, discharge or polyps Mouth - mucous membranes moist, pharynx normal without lesions Neck - supple, no significant adenopathy Chest - clear to auscultation, no wheezes, rales or rhonchi, symmetric air entry Heart - normal rate, regular rhythm, normal S1, S2, no murmurs, rubs, clicks or gallops Abdomen - soft, nontender, nondistended, no masses or organomegaly Extremities -  peripheral pulses normal, no pedal edema, no clubbing or cyanosis Back - no tenderness along spinous processes, no tenderness along paraspinal muscles, normal flexion and extension of hips bilaterally, normal strength in lower extremities bilaterally. No rash along left flank.

## 2012-02-14 NOTE — Assessment & Plan Note (Addendum)
Currently not taking medications: fosamax and calcium. Check CMP for Alkphos. Will likely restart meds.

## 2012-02-14 NOTE — Assessment & Plan Note (Signed)
BP not controlled due to non compliance with medications. 165/81. Patient reports side effects with lisinopril. Will start him on HCTZ 25mg  daily. Follow up in 1 month. Patient to return for CMP and lipid profile when fasting.

## 2012-02-14 NOTE — Assessment & Plan Note (Signed)
Patient to come back for lab draw for fasting lipid.

## 2012-02-14 NOTE — Assessment & Plan Note (Addendum)
Non specific and chronic. Will try ibuprofen or tylenol to see if this relieves symptoms. No red flags on exam or history

## 2012-02-21 ENCOUNTER — Other Ambulatory Visit: Payer: PRIVATE HEALTH INSURANCE

## 2012-03-05 ENCOUNTER — Other Ambulatory Visit: Payer: PRIVATE HEALTH INSURANCE

## 2012-03-05 NOTE — Progress Notes (Signed)
Did not draw labs because pt was not fasting.  He is rescheduled for next Monday. Dewitt Hoes, MLS

## 2012-03-12 ENCOUNTER — Other Ambulatory Visit: Payer: PRIVATE HEALTH INSURANCE

## 2012-03-12 DIAGNOSIS — M889 Osteitis deformans of unspecified bone: Secondary | ICD-10-CM

## 2012-03-12 LAB — COMPLETE METABOLIC PANEL WITH GFR
ALT: 11 U/L (ref 0–53)
Albumin: 4.2 g/dL (ref 3.5–5.2)
Alkaline Phosphatase: 74 U/L (ref 39–117)
GFR, Est Non African American: 47 mL/min — ABNORMAL LOW
Glucose, Bld: 103 mg/dL — ABNORMAL HIGH (ref 70–99)
Potassium: 4.3 mEq/L (ref 3.5–5.3)
Sodium: 141 mEq/L (ref 135–145)
Total Protein: 6.7 g/dL (ref 6.0–8.3)

## 2012-03-12 LAB — LIPID PANEL
HDL: 31 mg/dL — ABNORMAL LOW (ref >39)
LDL Cholesterol: 148 mg/dL — ABNORMAL HIGH (ref 0–99)
Total CHOL/HDL Ratio: 6.5 Ratio
VLDL: 22 mg/dL (ref 0–40)

## 2012-03-12 NOTE — Progress Notes (Signed)
CMP AND FLP DONE TODAY Natelie Ostrosky 

## 2012-03-15 ENCOUNTER — Encounter: Payer: Self-pay | Admitting: Family Medicine

## 2012-04-23 ENCOUNTER — Ambulatory Visit (INDEPENDENT_AMBULATORY_CARE_PROVIDER_SITE_OTHER): Payer: PRIVATE HEALTH INSURANCE | Admitting: Family Medicine

## 2012-04-23 ENCOUNTER — Encounter: Payer: Self-pay | Admitting: Family Medicine

## 2012-04-23 VITALS — BP 179/74 | HR 54 | Ht 66.0 in | Wt 176.0 lb

## 2012-04-23 DIAGNOSIS — R29818 Other symptoms and signs involving the nervous system: Secondary | ICD-10-CM

## 2012-04-23 DIAGNOSIS — R2689 Other abnormalities of gait and mobility: Secondary | ICD-10-CM

## 2012-04-23 DIAGNOSIS — M889 Osteitis deformans of unspecified bone: Secondary | ICD-10-CM

## 2012-04-23 DIAGNOSIS — E785 Hyperlipidemia, unspecified: Secondary | ICD-10-CM

## 2012-04-23 DIAGNOSIS — I10 Essential (primary) hypertension: Secondary | ICD-10-CM

## 2012-04-23 MED ORDER — PRAVASTATIN SODIUM 20 MG PO TABS: 20.0000 mg | ORAL_TABLET | Freq: Every day | ORAL | Status: DC

## 2012-04-23 MED ORDER — AMLODIPINE BESYLATE 5 MG PO TABS: 5.0000 mg | ORAL_TABLET | Freq: Every day | ORAL | Status: DC

## 2012-04-23 NOTE — Patient Instructions (Addendum)
For the blood pressure, I am adding an extra medicine called amlodipine 5mg  to take every day.  I am also starting a medicine for cholesterol to take every day.  Please come back in 1 month.  If you could, come back in 1 week or so to get your blood pressure checked by a nurse here. That way I will know if the blood pressure medicine is working.   For the loss of balance, it could be an inner ear issue. If you start falling or having trouble staying steady on your feet, I would like to see you back sooner.

## 2012-04-24 DIAGNOSIS — R2689 Other abnormalities of gait and mobility: Secondary | ICD-10-CM | POA: Insufficient documentation

## 2012-04-24 NOTE — Assessment & Plan Note (Signed)
Long standing problem. Inner ear disorder vs aging process vs folate/B12 deficiency. Will check CBC at next lab draw with repeat lipid, although given his reported diet, B12 and folate deficiency seem less likely. Washed out left ear which had cerumen buildup in case this could be contributing to balance difficulty. No evidence of parkinson's at this time, no shuffling gait, no tremor. Will continue to monitor.

## 2012-04-24 NOTE — Assessment & Plan Note (Addendum)
LDL above goal <130. Patient amenable to starting medication along with diet changes. Start pravastatin 20mg  daily and increase to 40mg  if patient tolerates well. Patient to return in 4-6 weeks with repeat Lipid panel at the time

## 2012-04-24 NOTE — Assessment & Plan Note (Signed)
Alk phos normal on latest CMP. Patient not having any joint or bone pain at this time. Will hold on fosamax at this time.

## 2012-04-24 NOTE — Progress Notes (Signed)
Patient ID: Paul [name], male   DOB: 1926-10-22, 78 y.o.   MRN: [id number] --- Subjective:  Paul [name] is a 78 y.o.male with h/o HTN, Paget's disease who presents for follow up. - HTN: takes HCTZ 25mg  daily. Did not take it morning prior to appointment and sometimes misses dose. No chest pain, no shortness of breath, no leg swelling. No palpitations. Doesn't check his BP at home.  - cholesterol: had fasting lipid panel done on 03/12/12 that showed LDL:148 and HDL: 31. He states that he eats eggs, bacon, pork, beef and chicken at home.  - loss of balance: feels like his equilibrium is off when he walks. This has been persistent for the last 8 months. It doesn't happen every time he walks but does happen on a regular basis. He denies any sensation of spinning of the room, only feeling that he might loose balance. He denies any falls during these episodes. Reports ringing in the ears occasionally.   ROS: see HPI Past Medical History: reviewed and updated medications and allergies. Social History: Tobacco: denies  Objective: Filed Vitals:   04/23/12 1028  BP: 179/74  Pulse: 54    Physical Examination:   General appearance - alert, well appearing, and in no distress, walks steadily without veering, mildly increased width of gait.  Ears - cerumen present in right ear, left TM normal Mouth - mucous membranes moist, pharynx normal without lesions Neck - supple, no significant adenopathy Chest - clear to auscultation, no wheezes, rales or rhonchi, symmetric air entry Heart - normal rate, regular rhythm, normal S1, S2, no murmurs, rubs, clicks or gallops Abdomen - soft, nontender, nondistended, no masses or organomegaly Extremities - peripheral pulses normal, no pedal edema

## 2012-04-24 NOTE — Assessment & Plan Note (Addendum)
Uncontrolled at 179/74. Despite patient not taking medication in am prior to appointment and missing occasional doses, it doesn't completely explain such elevated BP. Will start amlodipine 5mg . Patient to make nurse appointment in 1 week for BP check. Follow up in 1 month

## 2012-05-08 ENCOUNTER — Encounter: Payer: Self-pay | Admitting: Family Medicine

## 2012-05-08 ENCOUNTER — Ambulatory Visit (INDEPENDENT_AMBULATORY_CARE_PROVIDER_SITE_OTHER): Payer: PRIVATE HEALTH INSURANCE | Admitting: Family Medicine

## 2012-05-08 VITALS — BP 137/81 | HR 73 | Temp 97.6°F | Ht 66.0 in | Wt 177.6 lb

## 2012-05-08 DIAGNOSIS — Z23 Encounter for immunization: Secondary | ICD-10-CM

## 2012-05-08 DIAGNOSIS — M545 Low back pain: Secondary | ICD-10-CM

## 2012-05-08 DIAGNOSIS — K625 Hemorrhage of anus and rectum: Secondary | ICD-10-CM

## 2012-05-08 DIAGNOSIS — E785 Hyperlipidemia, unspecified: Secondary | ICD-10-CM

## 2012-05-08 DIAGNOSIS — I10 Essential (primary) hypertension: Secondary | ICD-10-CM

## 2012-05-08 MED ORDER — PRAVASTATIN SODIUM 20 MG PO TABS: 20.0000 mg | ORAL_TABLET | Freq: Every day | ORAL | Status: DC

## 2012-05-08 NOTE — Assessment & Plan Note (Signed)
Improved on amlodipine 5mg . Patient doesn't think he takes any other blood pressure medicine. Continue current management

## 2012-05-08 NOTE — Assessment & Plan Note (Signed)
Elevated at last lipid check LDL: 148 on 03/12/12. Patient not currently taking pravastatin. Resent Rx for patient to take. Will then check lipid panel in 2 months.

## 2012-05-08 NOTE — Patient Instructions (Signed)
For the back pain, I'm going to order an xray. For the pain, you can take tylenol (acetaminophen) over the counter 325mg  every 6 hours for pain, no more than 3000 mg in 24 hours.

## 2012-05-08 NOTE — Progress Notes (Signed)
Patient ID: Paul [name], male   DOB: 1926/11/15, 78 y.o.   MRN: [id number] --- Subjective:  Paul [name] is a 78 y.o.male who presents for follow up on high blood pressure. Other concerns include the following: - low back mid back pain: x 2 weeks, no history of injury, worst in the morning when he wakes up. 6/10. Walking helps the pain. Pain is intermittent and doesn't happen every day. He has not taken any medicine for it. Denies weakness, no urine or bowel incontinence.  Had a history of back pain 30 years ago but hasn't had any significant pain since.  - constipation and red blood on stool: last week, ate venison and felt constipated following this. Had hard bowel movement, strained and saw red blood on stool. Took milk of magnesia which helped with the constipation. Now having soft bowel movements, Has not had red blood per rectum since. Has not had colonoscopy.  - blood pressure: has been taking amlodipine 5mg  daily. Doesn't think he has been taking Hydrochlorothiazide. Denies chest pain, shortness of breath or lower extremity swelling. Overall feels better.   ROS: see HPI Past Medical History: reviewed and updated medications and allergies. Social History: Tobacco: denies   Objective: Filed Vitals:   05/08/12 1529  BP: 137/81  Pulse: 73  Temp: 97.6 F (36.4 C)    Physical Examination:   General appearance - alert, well appearing, and in no distress Chest - clear to auscultation, no wheezes, rales or rhonchi, symmetric air entry Heart - normal rate, regular rhythm, normal S1, S2, no murmurs, rubs, clicks or gallops Abdomen - soft, nontender, nondistended, no masses or organomegaly Extremities - peripheral pulses normal, no pedal edema Back - no tenderness to palpation along cervical, thoracic or lumbar spine, negative straight leg, 5/5 strength in knee flexion, extension, plantar flexion and dorsiflexion bilaterally, sensation to light touch intact

## 2012-05-08 NOTE — Assessment & Plan Note (Signed)
No bony tenderness along lumbar spine, but given age and h/o paget's disease, would like to obtain lumbar xray to rule out any lytic lesions or any bony changes. Recommended starting with tylenol for pain control.

## 2012-05-08 NOTE — Assessment & Plan Note (Signed)
Patient had not been to his screening colonoscopy a few months ago because of being afraid of procedure. Now patient expresses wanting to get it done.  Sent in referral to GI for colonoscopy.

## 2012-06-29 ENCOUNTER — Encounter (HOSPITAL_COMMUNITY): Payer: Self-pay | Admitting: Neurology

## 2012-06-29 ENCOUNTER — Emergency Department (HOSPITAL_COMMUNITY)
Admission: EM | Admit: 2012-06-29 | Discharge: 2012-06-29 | Disposition: A | Discharge status: Home / Self Care | Payer: PRIVATE HEALTH INSURANCE | Attending: Emergency Medicine | Admitting: Emergency Medicine

## 2012-06-29 DIAGNOSIS — Z79899 Other long term (current) drug therapy: Secondary | ICD-10-CM | POA: Insufficient documentation

## 2012-06-29 DIAGNOSIS — R599 Enlarged lymph nodes, unspecified: Secondary | ICD-10-CM | POA: Insufficient documentation

## 2012-06-29 DIAGNOSIS — E7800 Pure hypercholesterolemia, unspecified: Secondary | ICD-10-CM | POA: Insufficient documentation

## 2012-06-29 DIAGNOSIS — R609 Edema, unspecified: Secondary | ICD-10-CM

## 2012-06-29 DIAGNOSIS — I10 Essential (primary) hypertension: Secondary | ICD-10-CM | POA: Insufficient documentation

## 2012-06-29 HISTORY — DX: Pure hypercholesterolemia, unspecified: E78.00

## 2012-06-29 HISTORY — DX: Essential (primary) hypertension: I10

## 2012-06-29 LAB — RAPID STREP SCREEN (MED CTR MEBANE ONLY): Streptococcus, Group A Screen (Direct): NEGATIVE

## 2012-06-29 MED ORDER — AMOXICILLIN 500 MG PO CAPS: 500.0000 mg | ORAL_CAPSULE | Freq: Three times a day (TID) | ORAL | Status: DC

## 2012-06-29 NOTE — ED Provider Notes (Signed)
Medical screening examination/treatment/procedure(s) were conducted as a shared visit with non-physician practitioner(s) and myself.  I personally evaluated the patient during the encounter Pt has swelling of left submandibular gland.  I did not feel any stone on palpating the floor of the mouth.  Rx with amoxicillin, F/U with ENT if not better.  Carleene Cooper III, MD 06/29/12 2007

## 2012-06-29 NOTE — ED Provider Notes (Signed)
History     CSN: [id number]  Arrival date & time 06/29/12  5784   First MD Initiated Contact with Patient 06/29/12 [phone]      Chief Complaint  Patient presents with  . Sore Throat    (Consider location/radiation/quality/duration/timing/severity/associated sxs/prior treatment) HPI Comments: To send 78 year old male, who presents to the emergency department with chief complaint of sore throat. Patient reports having sore throat for one week, which resolved and then came back this morning. He is concerned about a lump on the anterior surface of his neck. Patient states it is painful to swallow, but denies pain elsewhere. He has not tried taking any medications for his symptoms.  The history is provided by the patient. No language interpreter was used.    Past Medical History  Diagnosis Date  . Hypertension   . Hypercholesteremia     History reviewed. No pertinent past surgical history.  No family history on file.  History  Substance Use Topics  . Smoking status: Never Smoker   . Smokeless tobacco: Not on file  . Alcohol Use: No      Review of Systems  All other systems reviewed and are negative.    Allergies  Review of patient's allergies indicates no known allergies.  Home Medications   Current Outpatient Rx  Name  Route  Sig  Dispense  Refill  . AMLODIPINE BESYLATE 5 MG PO TABS   Oral   Take 1 tablet (5 mg total) by mouth daily.   30 tablet   3   . ALKA-SELTZER PO   Oral   Take 1 packet by mouth 2 (two) times daily as needed. For pain         . PRAVASTATIN SODIUM 20 MG PO TABS   Oral   Take 1 tablet (20 mg total) by mouth daily.   90 tablet   1     BP 114/74  Pulse 72  Temp 97.3 F (36.3 C) (Oral)  Resp 16  SpO2 98%  Physical Exam  Nursing note and vitals reviewed. Constitutional: He is oriented to person, place, and time. He appears well-developed and well-nourished.  HENT:  Head: Normocephalic and atraumatic.  Mouth/Throat:  Oropharynx is clear and moist.       Submandibular lymphadenopathy  Eyes: Conjunctivae normal and EOM are normal. Pupils are equal, round, and reactive to light.  Neck: Normal range of motion. Neck supple.  Cardiovascular: Normal rate, regular rhythm and normal heart sounds.   Pulmonary/Chest: Effort normal and breath sounds normal. No respiratory distress. He has no wheezes. He has no rales. He exhibits no tenderness.  Abdominal: Soft. Bowel sounds are normal.  Musculoskeletal: Normal range of motion.  Neurological: He is alert and oriented to person, place, and time.  Skin: Skin is warm and dry.  Psychiatric: He has a normal mood and affect. His behavior is normal. Judgment and thought content normal.    ED Course  Procedures (including critical care time)   Labs Reviewed  RAPID STREP SCREEN   Results for orders placed during the hospital encounter of 06/29/12  RAPID STREP SCREEN      Component Value Range   Streptococcus, Group A Screen (Direct) NEGATIVE  NEGATIVE        1. Submandibular gland swelling       MDM  78 year old male with sore throat and submandibular lymphadenopathy. This patient has been seen by and discussed with Dr. Ignacia Palma. I'm going to discharge the patient to home with amoxicillin. Will also  recommend ENT referral if no improvement on antibiotic. Patient is agreeable with this plan. Patient is stable and ready for discharge.        Roxy Horseman, PA-C 06/29/12 1204

## 2012-06-29 NOTE — ED Notes (Signed)
Pt reports sore throat. Painful to swallowing. Started 3 weeks ago, went away came back this morning. Airway intact. Ambulatory. A x 4

## 2012-07-03 ENCOUNTER — Ambulatory Visit: Payer: PRIVATE HEALTH INSURANCE | Admitting: Family Medicine

## 2012-11-24 ENCOUNTER — Encounter (HOSPITAL_COMMUNITY): Payer: Self-pay | Admitting: Emergency Medicine

## 2012-11-24 ENCOUNTER — Emergency Department (HOSPITAL_COMMUNITY)
Admission: EM | Admit: 2012-11-24 | Discharge: 2012-11-24 | Disposition: A | Discharge status: Home / Self Care | Payer: PRIVATE HEALTH INSURANCE | Attending: Emergency Medicine | Admitting: Emergency Medicine

## 2012-11-24 DIAGNOSIS — E7800 Pure hypercholesterolemia, unspecified: Secondary | ICD-10-CM | POA: Insufficient documentation

## 2012-11-24 DIAGNOSIS — I10 Essential (primary) hypertension: Secondary | ICD-10-CM | POA: Insufficient documentation

## 2012-11-24 DIAGNOSIS — S71109A Unspecified open wound, unspecified thigh, initial encounter: Secondary | ICD-10-CM | POA: Insufficient documentation

## 2012-11-24 DIAGNOSIS — Z7982 Long term (current) use of aspirin: Secondary | ICD-10-CM | POA: Insufficient documentation

## 2012-11-24 DIAGNOSIS — Y939 Activity, unspecified: Secondary | ICD-10-CM | POA: Insufficient documentation

## 2012-11-24 DIAGNOSIS — W268XXA Contact with other sharp object(s), not elsewhere classified, initial encounter: Secondary | ICD-10-CM | POA: Insufficient documentation

## 2012-11-24 DIAGNOSIS — S71111A Laceration without foreign body, right thigh, initial encounter: Secondary | ICD-10-CM

## 2012-11-24 DIAGNOSIS — Z23 Encounter for immunization: Secondary | ICD-10-CM | POA: Insufficient documentation

## 2012-11-24 DIAGNOSIS — Z79899 Other long term (current) drug therapy: Secondary | ICD-10-CM | POA: Insufficient documentation

## 2012-11-24 DIAGNOSIS — S71009A Unspecified open wound, unspecified hip, initial encounter: Secondary | ICD-10-CM | POA: Insufficient documentation

## 2012-11-24 DIAGNOSIS — Y929 Unspecified place or not applicable: Secondary | ICD-10-CM | POA: Insufficient documentation

## 2012-11-24 MED ORDER — TETANUS-DIPHTHERIA TOXOIDS TD 5-2 LFU IM INJ
0.5000 mL | INJECTION | Freq: Once | INTRAMUSCULAR | Status: AC
  Administered 2012-11-24: 0.5 mL via INTRAMUSCULAR
  Filled 2012-11-24: qty 0.5

## 2012-11-24 MED ORDER — OXYCODONE-ACETAMINOPHEN 5-325 MG PO TABS: 2.0000 | ORAL_TABLET | ORAL | Status: DC | PRN

## 2012-11-24 MED ORDER — CEPHALEXIN 500 MG PO CAPS: 500.0000 mg | ORAL_CAPSULE | Freq: Four times a day (QID) | ORAL | Status: DC

## 2012-11-24 NOTE — ED Notes (Signed)
Pt here s/p rt thigh in grinder, now with lg lac minimal bleeding

## 2012-11-24 NOTE — ED Provider Notes (Signed)
History     CSN: [id number]  Arrival date & time 11/24/12  0946   First MD Initiated Contact with Patient 11/24/12 1007      Chief Complaint  Patient presents with  . Extremity Laceration    (Consider location/radiation/quality/duration/timing/severity/associated sxs/prior treatment) The history is provided by the patient.   patient here after sustaining a laceration to the right distal anterior thigh yesterday with a soft. He controlled the bleeding with direct pressure but the bleeding continued today was likely present. Denies any numbness or tingling distal to the injury. Is able to flex and extend his right knee appropriate. His tetanus status is not current. Pain characterized as sharp and worse with movement.  Past Medical History  Diagnosis Date  . Hypertension   . Hypercholesteremia     History reviewed. No pertinent past surgical history.  No family history on file.  History  Substance Use Topics  . Smoking status: Never Smoker   . Smokeless tobacco: Not on file  . Alcohol Use: No      Review of Systems  All other systems reviewed and are negative.    Allergies  Review of patient's allergies indicates no known allergies.  Home Medications   Current Outpatient Rx  Name  Route  Sig  Dispense  Refill  . amLODipine (NORVASC) 5 MG tablet   Oral   Take 1 tablet (5 mg total) by mouth daily.   30 tablet   3   . amoxicillin (AMOXIL) 500 MG capsule   Oral   Take 1 capsule (500 mg total) by mouth 3 (three) times daily.   30 capsule   0   . Aspirin Effervescent (ALKA-SELTZER PO)   Oral   Take 1 packet by mouth 2 (two) times daily as needed. For pain         . pravastatin (PRAVACHOL) 20 MG tablet   Oral   Take 1 tablet (20 mg total) by mouth daily.   90 tablet   1     BP 143/59  Pulse 80  Temp(Src) 98.5 F (36.9 C)  Resp 20  SpO2 97%  Physical Exam  Nursing note and vitals reviewed. Constitutional: He is oriented to person, place, and  time. He appears well-developed and well-nourished.  Non-toxic appearance. No distress.  HENT:  Head: Normocephalic and atraumatic.  Eyes: Conjunctivae, EOM and lids are normal. Pupils are equal, round, and reactive to light.  Neck: Normal range of motion. Neck supple. No tracheal deviation present. No mass present.  Cardiovascular: Normal rate, regular rhythm and normal heart sounds.  Exam reveals no gallop.   No murmur heard. Pulmonary/Chest: Effort normal and breath sounds normal. No stridor. No respiratory distress. He has no decreased breath sounds. He has no wheezes. He has no rhonchi. He has no rales.  Abdominal: Soft. Normal appearance and bowel sounds are normal. He exhibits no distension. There is no tenderness. There is no rebound and no CVA tenderness.  Musculoskeletal: Normal range of motion. He exhibits no edema and no tenderness.       Legs: Neurological: He is alert and oriented to person, place, and time. He has normal strength. No cranial nerve deficit or sensory deficit. GCS eye subscore is 4. GCS verbal subscore is 5. GCS motor subscore is 6.  Skin: Skin is warm and dry. No abrasion and no rash noted.  Psychiatric: He has a normal mood and affect. His speech is normal and behavior is normal.    ED Course  Procedures (including critical care time)  Labs Reviewed - No data to display No results found.   No diagnosis found.    MDM  LACERATION REPAIR Performed by: Toy Baker Authorized by: Toy Baker Consent: Verbal consent obtained. Risks and benefits: risks, benefits and alternatives were discussed Consent given by: patient Patient identity confirmed: provided demographic data Prepped and Draped in normal sterile fashion Wound explored  Laceration Location: Right anterior thigh  Laceration Length: 4cm  No Foreign Bodies seen or palpated  Anesthesia: local infiltration  Local anesthetic: lidocaine 2% 1 epinephrine  Anesthetic total: 10  ml  Irrigation method: syringe Amount of cleaning: standard  Skin closure: Right anterior thigh   Number of sutures: 3   Technique: Simple with staples   Patient tolerance: Patient tolerated the procedure well with no immediate complications.  The patient's tetanus status was updated here. Wound was debrided and irrigated extensively. Staples place for loose approximation. Will place on antibiotics have patient return in 2 days for wound check. He'll also be given more specific infectious precautions to return for       Toy Baker, MD 11/24/12 1037

## 2012-11-26 ENCOUNTER — Encounter (HOSPITAL_COMMUNITY): Payer: Self-pay | Admitting: Emergency Medicine

## 2012-11-26 ENCOUNTER — Emergency Department (HOSPITAL_COMMUNITY)
Admission: EM | Admit: 2012-11-26 | Discharge: 2012-11-26 | Disposition: A | Discharge status: Home / Self Care | Payer: PRIVATE HEALTH INSURANCE | Attending: Emergency Medicine | Admitting: Emergency Medicine

## 2012-11-26 DIAGNOSIS — Z8639 Personal history of other endocrine, nutritional and metabolic disease: Secondary | ICD-10-CM | POA: Insufficient documentation

## 2012-11-26 DIAGNOSIS — Z5189 Encounter for other specified aftercare: Secondary | ICD-10-CM

## 2012-11-26 DIAGNOSIS — Z7982 Long term (current) use of aspirin: Secondary | ICD-10-CM | POA: Insufficient documentation

## 2012-11-26 DIAGNOSIS — Z862 Personal history of diseases of the blood and blood-forming organs and certain disorders involving the immune mechanism: Secondary | ICD-10-CM | POA: Insufficient documentation

## 2012-11-26 DIAGNOSIS — I10 Essential (primary) hypertension: Secondary | ICD-10-CM | POA: Insufficient documentation

## 2012-11-26 DIAGNOSIS — Z4801 Encounter for change or removal of surgical wound dressing: Secondary | ICD-10-CM | POA: Insufficient documentation

## 2012-11-26 NOTE — ED Notes (Signed)
Pt is here today to have his stables removed from his R leg, which were put in on Saturday. Pt sts pain is better. No drainage noted.

## 2012-11-26 NOTE — ED Provider Notes (Signed)
History     CSN: [id number]  Arrival date & time 11/26/12  1149   First MD Initiated Contact with Patient 11/26/12 1240      No chief complaint on file.   (Consider location/radiation/quality/duration/timing/severity/associated sxs/prior treatment) HPI Comments: Pt presents to the ED for a wound check.  Right thigh laceration repaired in the ED 11/25/11 with 3 staples.  Wound has not been swelling or draining since repair.  Normal ROM of right leg.  Has been taking keflex as directed.  Pain well controlled with percocet. Denies any new sx at this time.  The history is provided by the patient.    Past Medical History  Diagnosis Date  . Hypertension   . Hypercholesteremia     No past surgical history on file.  No family history on file.  History  Substance Use Topics  . Smoking status: Never Smoker   . Smokeless tobacco: Not on file  . Alcohol Use: No      Review of Systems  Skin: Positive for wound.  All other systems reviewed and are negative.    Allergies  Review of patient's allergies indicates no known allergies.  Home Medications   Current Outpatient Rx  Name  Route  Sig  Dispense  Refill  . aspirin EC 81 MG tablet   Oral   Take 81 mg by mouth daily.         . cephALEXin (KEFLEX) 500 MG capsule   Oral   Take 1 capsule (500 mg total) by mouth 4 (four) times daily.   20 capsule   0   . oxyCODONE-acetaminophen (PERCOCET/ROXICET) 5-325 MG per tablet   Oral   Take 2 tablets by mouth every 4 (four) hours as needed for pain.   10 tablet   0     BP 151/68  Pulse 58  Temp(Src) 97.6 F (36.4 C) (Oral)  SpO2 98%  Physical Exam  Nursing note and vitals reviewed. Constitutional: He is oriented to person, place, and time. He appears well-developed and well-nourished.  HENT:  Head: Normocephalic and atraumatic.  Eyes: Conjunctivae and EOM are normal.  Neck: Normal range of motion. Neck supple.  Cardiovascular: Normal rate, regular rhythm and  normal heart sounds.   Pulmonary/Chest: Effort normal and breath sounds normal. He has no wheezes.  Musculoskeletal: Normal range of motion. He exhibits no edema.       Legs: Right thigh laceration healing appropriately with 3 staples in place, no surrounding swelling, erythema, or signs of infection  Neurological: He is alert and oriented to person, place, and time.  Skin: Skin is warm and dry.  Psychiatric: He has a normal mood and affect.    ED Course  Procedures (including critical care time)  Labs Reviewed - No data to display No results found.   1. Visit for wound check       MDM   Pt presenting to the ED for wound check.  Wound well approximated, healing well with staples still in place.  No FB, erythema, swelling, or signs of infection.  FU with [facility] urgent care for staple removal in 4-5 days.  Continue taking abx and pain meds as directed.  Return precautions advised.       Garlon Hatchet, PA-C 11/26/12 1413

## 2012-11-26 NOTE — ED Notes (Signed)
Pt states got 3 staples in R knee Saturday, was told to come back Monday to have them removed, pt states "there falling out". Staples intact.

## 2012-11-27 NOTE — ED Provider Notes (Signed)
Medical screening examination/treatment/procedure(s) were performed by non-physician practitioner and as supervising physician I was immediately available for consultation/collaboration.   Shoaib Siefker L Kacper Cartlidge, MD 11/27/12 0748 

## 2012-12-03 ENCOUNTER — Ambulatory Visit (INDEPENDENT_AMBULATORY_CARE_PROVIDER_SITE_OTHER): Payer: PRIVATE HEALTH INSURANCE | Admitting: *Deleted

## 2012-12-03 ENCOUNTER — Encounter: Payer: Self-pay | Admitting: *Deleted

## 2012-12-03 DIAGNOSIS — Z4802 Encounter for removal of sutures: Secondary | ICD-10-CM

## 2012-12-03 NOTE — Progress Notes (Signed)
3 staples removed from upper right leg - wound clean dry and intact - Wyatt Haste, RN

## 2012-12-03 NOTE — Telephone Encounter (Signed)
This encounter was created in error - please disregard.

## 2013-05-27 ENCOUNTER — Ambulatory Visit (INDEPENDENT_AMBULATORY_CARE_PROVIDER_SITE_OTHER): Payer: PRIVATE HEALTH INSURANCE | Admitting: Family Medicine

## 2013-05-27 ENCOUNTER — Encounter: Payer: Self-pay | Admitting: Family Medicine

## 2013-05-27 ENCOUNTER — Telehealth: Payer: Self-pay | Admitting: *Deleted

## 2013-05-27 VITALS — BP 190/80 | HR 54 | Temp 97.7°F | Ht 66.0 in | Wt 179.4 lb

## 2013-05-27 DIAGNOSIS — N401 Enlarged prostate with lower urinary tract symptoms: Secondary | ICD-10-CM

## 2013-05-27 DIAGNOSIS — R0602 Shortness of breath: Secondary | ICD-10-CM

## 2013-05-27 DIAGNOSIS — N3941 Urge incontinence: Secondary | ICD-10-CM

## 2013-05-27 DIAGNOSIS — I10 Essential (primary) hypertension: Secondary | ICD-10-CM

## 2013-05-27 LAB — POCT URINALYSIS DIPSTICK
Blood, UA: NEGATIVE
Leukocytes, UA: NEGATIVE
Nitrite, UA: NEGATIVE
Protein, UA: NEGATIVE
Urobilinogen, UA: 0.2
pH, UA: 6

## 2013-05-27 LAB — COMPREHENSIVE METABOLIC PANEL
AST: 15 U/L (ref 0–37)
Alkaline Phosphatase: 83 U/L (ref 39–117)
BUN: 16 mg/dL (ref 6–23)
Creat: 1.21 mg/dL (ref 0.50–1.35)
Glucose, Bld: 76 mg/dL (ref 70–99)

## 2013-05-27 MED ORDER — AMLODIPINE BESYLATE 5 MG PO TABS: 5.0000 mg | ORAL_TABLET | Freq: Every day | ORAL | Status: DC

## 2013-05-27 MED ORDER — TAMSULOSIN HCL 0.4 MG PO CAPS: 0.4000 mg | ORAL_CAPSULE | Freq: Every day | ORAL | Status: DC

## 2013-05-27 NOTE — Patient Instructions (Signed)
Please follow up with me in 3 weeks.

## 2013-05-27 NOTE — Telephone Encounter (Signed)
Spoke with patient's daughter and gave the appointment for 2D echo appointment set for Wednesday 10/22 @ 1 pm patient needs to arrive @ 12:45pm

## 2013-05-27 NOTE — Progress Notes (Signed)
Patient ID: Calil Amor    DOB: [birth year], 79 y.o.   MRN: [id number] --- Subjective:  Fenix is a 79 y.o.male who presents for concern of urinary incontinence and shortness of breath.  - urinary incontinence: urge incontinence that has been ongoing for a couple of years, but worsening recently. Wearing adult protective underwear due to some incontinence. Urinates 5-6 times per day and 3 times per night. He has a feeling of emptying his bladder but then goes again shortly after.   - shortness of breath: has been present for the last 2 weeks especially when he starts riding his stationary bike. He feels short winded and then is able to bike through it. He has also been short winded with walking and going up stairs more than normal. He does need 2 pillows for comfort which does include breathing more easily. Denies any PND.  No lower extremity swelling.   - Hypertension: stopped taking his medication because it was causing erectile dysfunction and he didn't think he needed it. No chest pain. Some shortness of breath as above.   ROS: see HPI Past Medical History: reviewed and updated medications and allergies. Social History: Tobacco: none  Objective: Filed Vitals:   05/27/13 0918  BP: 190/80  Pulse: 54  Temp: 97.7 F (36.5 C)    Physical Examination:   General appearance - alert, well appearing, and in no distress Chest - clear to auscultation, no wheezes, rales or rhonchi, symmetric air entry Heart - bradycardia, normal rhythm, no murmur  Abdomen - soft, nontender, nondistended, no masses or organomegaly Extremities - trace pitting edema

## 2013-05-28 DIAGNOSIS — R0602 Shortness of breath: Secondary | ICD-10-CM | POA: Insufficient documentation

## 2013-05-28 NOTE — Assessment & Plan Note (Signed)
New onset shortness of breath. Appears to have some orthopnea.  Will obtain echo to rule out cardiac etiology.

## 2013-05-28 NOTE — Assessment & Plan Note (Addendum)
Elevated due to non compliance of medicine. Restart amlodipine 5mg  daily and flomax added as well which will have a minimal effect on BP. Obtain CMP.  F/u 3 weeks

## 2013-05-28 NOTE — Assessment & Plan Note (Signed)
Patient denies any h/o BPH although it is part of his problem list.  - obtain PSA and will therefore defer prostate exam to avoid falsely elevated value - start flomax  - UA - BMP

## 2013-05-31 ENCOUNTER — Telehealth: Payer: Self-pay | Admitting: Family Medicine

## 2013-05-31 NOTE — Telephone Encounter (Signed)
Called patient and let him know lab results were normal.   [name], PGY-3 Family Medicine Resident

## 2013-06-05 ENCOUNTER — Ambulatory Visit (HOSPITAL_COMMUNITY)
Admission: RE | Admit: 2013-06-05 | Discharge: 2013-06-05 | Disposition: A | Discharge status: Home / Self Care | Payer: PRIVATE HEALTH INSURANCE | Source: Ambulatory Visit | Attending: Family Medicine | Admitting: Family Medicine

## 2013-06-05 DIAGNOSIS — I10 Essential (primary) hypertension: Secondary | ICD-10-CM

## 2013-06-05 DIAGNOSIS — I517 Cardiomegaly: Secondary | ICD-10-CM

## 2013-06-05 DIAGNOSIS — R0602 Shortness of breath: Secondary | ICD-10-CM | POA: Insufficient documentation

## 2013-06-06 ENCOUNTER — Telehealth: Payer: Self-pay | Admitting: Family Medicine

## 2013-06-06 NOTE — Telephone Encounter (Signed)
Called patient and let him know of the results of his 2D echo. Recommended that he follow up with me in 2 weeks   [name], PGY-3  Family Medicine Resident

## 2013-06-06 NOTE — Telephone Encounter (Signed)
Will fwd to MD for review.  Izaan Kingbird L, CMA  

## 2013-06-06 NOTE — Telephone Encounter (Signed)
Pt called and would like to know what his results are of his Echo on 10/22. He would like Dr. Gwenlyn Saran to call him as soon as possible. JW

## 2013-06-06 NOTE — Progress Notes (Signed)
Called patient and let him know of the results of his 2D echo. Recommended that he follow up with me in 2 weeks   Paul [name], PGY-3  Family Medicine Resident  

## 2013-07-09 ENCOUNTER — Encounter: Payer: Self-pay | Admitting: Sports Medicine

## 2013-07-09 ENCOUNTER — Ambulatory Visit (INDEPENDENT_AMBULATORY_CARE_PROVIDER_SITE_OTHER): Payer: PRIVATE HEALTH INSURANCE | Admitting: Sports Medicine

## 2013-07-09 VITALS — BP 165/78 | HR 81 | Temp 98.2°F | Ht 66.0 in | Wt 184.0 lb

## 2013-07-09 DIAGNOSIS — I5031 Acute diastolic (congestive) heart failure: Secondary | ICD-10-CM

## 2013-07-09 DIAGNOSIS — I10 Essential (primary) hypertension: Secondary | ICD-10-CM

## 2013-07-09 HISTORY — DX: Acute diastolic (congestive) heart failure: I50.31

## 2013-07-09 MED ORDER — FUROSEMIDE 20 MG PO TABS: 20.0000 mg | ORAL_TABLET | Freq: Every day | ORAL | Status: DC

## 2013-07-09 NOTE — Progress Notes (Signed)
  Paul [name] - 79 y.o. male MRN [id number]  Date of birth: 11/08/1926  CC, HPI, INTERVAL HISTORY & ROS  [name] is here today for same day appointment due to abdominal distention.      He reports 1 month progressive weight gain, abdominal distention, lower extremity edema, bendopnea, decreased exercise tolerance.    Pt denies chest pain, dyspnea at rest or exertion, PND, lower extremity edema.  Patient denies any facial asymmetry, unilateral weakness, or dysarthria.  Recent echocardiogram showed diastolic dysfunction with preserved ejection fraction.  History  Past Medical, Surgical, Social, and Family History Reviewed per EMR Medications and Allergies reviewed and all updated if necessary. Objective Findings  VITALS: HR: 81 bpm  BP: 165/78 mmHg  TEMP: 98.2 F (36.8 C) (Oral)  RESP:    HT: 5\' 6"  (167.6 cm)  WT: 184 lb (83.462 kg)  BMI: 29.8   BP Readings from Last 3 Encounters:  07/09/13 165/78  05/27/13 190/80  11/26/12 151/68   Wt Readings from Last 3 Encounters:  07/09/13 184 lb (83.462 kg)  05/27/13 179 lb 6.4 oz (81.375 kg)  05/08/12 177 lb 9.6 oz (80.559 kg)     PHYSICAL EXAM: GENERAL:  elderly African American male. In no discomfort; no respiratory distress  PSYCH: alert and appropriate, good insight   HNEENT:  prominent carotid pulsation, positive hepatojugular reflux   CARDIO: RRR, S1/S2 heard, no murmur, no S3   LUNGS:  by basilar crackles   ABDOMEN:  distended, no fluid wave   EXTREM:  Warm, well perfused.  Moves all 4 extremities spontaneously; no lateralization.  1Distal pulses +/4.  2+ pretibial edema.  GU:   SKIN:     Assessment & Plan   Problems addressed today: General Plan & Pt Instructions:  1. Acute diastolic heart failure   2. Essential hypertension, benign       Start Lasix daily  Weight yourself every morning       For further discussion of A/P and for follow up issues see problem based charting if applicable.

## 2013-07-09 NOTE — Assessment & Plan Note (Signed)
BMET today for Cr prior to Lasix, check proBNP for baseline Lasix added  Follow up with Dr. Gwenlyn Saran

## 2013-07-09 NOTE — Patient Instructions (Signed)
   Start Lasix daily  Weight yourself every morning     If you need anything prior to your next visit please call the clinic. Please Bring all medications or accurate medication list with you to each appointment; an accurate medication list is essential in providing you the best care possible.    Here are some basic nutrition rules to remember:  "Eat Real Foods & Drink Real Drinks" - if you think it was made in a factory . . it is likely best to avoid it as a staple in your diet.  Limiting these types of foods to 1-2 times per week is a good idea.  Sticking with fresh fruits and vegetables as well as home cooked meals will typically provide more nutrition and less salt than prepackaged meals.     Limit the amount of sugar sweetened and artificially sweetened foods and beverages.  Sticking with water flavored with a slice of lemon, lime or orange is a great option if you want something with flavor in it.  Using flavored seltzer water to flavor plain water will also add some bite if you want something more than flavor.  Avoid soda, juices and generally any bottled beverage is a good idea.     Eat at least 3 meals and 1-2 snacks per day.  Aim for no more than 5 hours between eating.   Here are 2 of my favorite web sites that provide great nutrition and exercise advice.   www.eats[place]movemore[place].com www.choosemyplate.gov

## 2013-07-10 LAB — BASIC METABOLIC PANEL
BUN: 20 mg/dL (ref 6–23)
CO2: 18 mEq/L — ABNORMAL LOW (ref 19–32)
Calcium: 9.2 mg/dL (ref 8.4–10.5)
Creat: 1.41 mg/dL — ABNORMAL HIGH (ref 0.50–1.35)
Glucose, Bld: 91 mg/dL (ref 70–99)
Potassium: 3.9 mEq/L (ref 3.5–5.3)
Sodium: 139 mEq/L (ref 135–145)

## 2013-07-10 NOTE — Assessment & Plan Note (Addendum)
Add Lasix, likely fluid overload contributing to a pressure Continue TLC (Therapeutic Lifestyle Changes) >50% of this 25 minute visit spent in direct patient counseling and/or coordination of care.

## 2013-09-23 ENCOUNTER — Other Ambulatory Visit: Payer: Self-pay | Admitting: Family Medicine

## 2014-01-14 ENCOUNTER — Telehealth: Payer: Self-pay | Admitting: Family Medicine

## 2014-01-23 ENCOUNTER — Telehealth: Payer: Self-pay | Admitting: Family Medicine

## 2014-01-23 NOTE — Telephone Encounter (Signed)
Attempted to call patient, he does not have voicemail. If he calls back please ask him what medication he is taking for acid reflux as we do not have that in his record.[name], Paul Medin

## 2014-01-23 NOTE — Telephone Encounter (Signed)
Needs refill on acid reflux medicine Pharmacy: [facility] on [address] and [address]

## 2014-01-23 NOTE — Telephone Encounter (Signed)
Please ask patient what medication he would like, because it is not listed on his medication list in our system. Thank you!  [name], PGY-3 Family Medicine Resident

## 2014-01-23 NOTE — Telephone Encounter (Signed)
Forward to PCP for refill.Busick, Arno Lee  

## 2014-01-24 NOTE — Telephone Encounter (Signed)
Attempted to call patient again, no voicemail. If he returns call, please read message below.Paul [name], Paul [name]

## 2014-01-28 NOTE — Telephone Encounter (Signed)
Patient never returned call.Busick, Matilde Lee  

## 2014-01-29 NOTE — Telephone Encounter (Signed)
Tried calling patient again to see what reflux medicine he needs, but no answer and no option to leave message.  Marena ChancyStephanie Shelitha Magley, PGY-3 Family Medicine Resident

## 2014-04-25 ENCOUNTER — Encounter: Payer: Self-pay | Admitting: Family Medicine

## 2014-04-25 ENCOUNTER — Ambulatory Visit (INDEPENDENT_AMBULATORY_CARE_PROVIDER_SITE_OTHER): Payer: PRIVATE HEALTH INSURANCE | Admitting: Family Medicine

## 2014-04-25 VITALS — BP 202/80 | HR 51 | Temp 98.1°F | Wt 177.0 lb

## 2014-04-25 DIAGNOSIS — N401 Enlarged prostate with lower urinary tract symptoms: Secondary | ICD-10-CM

## 2014-04-25 DIAGNOSIS — I10 Essential (primary) hypertension: Secondary | ICD-10-CM

## 2014-04-25 DIAGNOSIS — N138 Other obstructive and reflux uropathy: Secondary | ICD-10-CM

## 2014-04-25 DIAGNOSIS — N400 Enlarged prostate without lower urinary tract symptoms: Secondary | ICD-10-CM

## 2014-04-25 DIAGNOSIS — I5031 Acute diastolic (congestive) heart failure: Secondary | ICD-10-CM

## 2014-04-25 MED ORDER — FUROSEMIDE 20 MG PO TABS: 20.0000 mg | ORAL_TABLET | Freq: Every day | ORAL | Status: DC

## 2014-04-25 MED ORDER — ASPIRIN EC 81 MG PO TBEC: 81.0000 mg | DELAYED_RELEASE_TABLET | Freq: Every day | ORAL | Status: DC

## 2014-04-25 MED ORDER — TAMSULOSIN HCL 0.4 MG PO CAPS: ORAL_CAPSULE | ORAL | Status: DC

## 2014-04-25 MED ORDER — AMLODIPINE BESYLATE 5 MG PO TABS: 5.0000 mg | ORAL_TABLET | Freq: Every day | ORAL | Status: DC

## 2014-04-25 NOTE — Patient Instructions (Signed)
I have refilled all of your medications. I would like to see you back for a blood pressure check next week so that we can be sure your blood pressure is better on your medicine.

## 2014-05-04 NOTE — Assessment & Plan Note (Addendum)
Asymptomatic, volume status normal - refilled lasix - continue ASA

## 2014-05-04 NOTE — Assessment & Plan Note (Signed)
Refilled flomax

## 2014-05-04 NOTE — Progress Notes (Signed)
   Subjective:    Patient ID: Paul [name], male    DOB: 1927/03/25, 80 y.o.   MRN: [id number]  HPI Pt presents for f/u of heart failure and hypertension. He has been feeling well but worried about his heart. No complaints. Out of meds for over a week so did not take this am.   Review of Systems  Constitutional: Negative for fever, chills, diaphoresis, fatigue and unexpected weight change.  Respiratory: Negative for shortness of breath.   Cardiovascular: Negative for chest pain, palpitations and leg swelling.  Gastrointestinal: Negative for nausea, vomiting, abdominal pain and diarrhea.  Neurological: Negative for dizziness, syncope, light-headedness and headaches.  All other systems reviewed and are negative.      Objective:   Physical Exam  Nursing note and vitals reviewed. Constitutional: He is oriented to person, place, and time. He appears well-developed and well-nourished. No distress.  HENT:  Head: Normocephalic and atraumatic.  Eyes: Conjunctivae are normal. Right eye exhibits no discharge. Left eye exhibits no discharge. No scleral icterus.  Cardiovascular: Normal rate, regular rhythm, normal heart sounds and intact distal pulses.   No murmur heard. Pulmonary/Chest: Effort normal and breath sounds normal. No respiratory distress. He has no wheezes.  Abdominal: Soft. Bowel sounds are normal. He exhibits no distension. There is no tenderness.  Musculoskeletal: He exhibits no edema.  Neurological: He is alert and oriented to person, place, and time.  Skin: Skin is warm and dry. No rash noted. He is not diaphoretic. No pallor.  Psychiatric: He has a normal mood and affect. His behavior is normal.          Assessment & Plan:

## 2014-05-04 NOTE — Assessment & Plan Note (Addendum)
Uncontrolled today but out of meds for >1 week, recheck improved at 175/90 - refill amlodipine - f/u in 1 week for bp check

## 2014-08-05 ENCOUNTER — Emergency Department (HOSPITAL_COMMUNITY): Payer: PRIVATE HEALTH INSURANCE

## 2014-08-05 ENCOUNTER — Encounter (HOSPITAL_COMMUNITY): Payer: Self-pay | Admitting: Cardiology

## 2014-08-05 ENCOUNTER — Emergency Department (HOSPITAL_COMMUNITY)
Admission: EM | Admit: 2014-08-05 | Discharge: 2014-08-05 | Disposition: A | Discharge status: Home / Self Care | Payer: PRIVATE HEALTH INSURANCE | Attending: Emergency Medicine | Admitting: Emergency Medicine

## 2014-08-05 DIAGNOSIS — I10 Essential (primary) hypertension: Secondary | ICD-10-CM | POA: Diagnosis not present

## 2014-08-05 DIAGNOSIS — J189 Pneumonia, unspecified organism: Secondary | ICD-10-CM

## 2014-08-05 DIAGNOSIS — Z79899 Other long term (current) drug therapy: Secondary | ICD-10-CM | POA: Insufficient documentation

## 2014-08-05 DIAGNOSIS — J159 Unspecified bacterial pneumonia: Secondary | ICD-10-CM | POA: Diagnosis not present

## 2014-08-05 DIAGNOSIS — Z7982 Long term (current) use of aspirin: Secondary | ICD-10-CM | POA: Diagnosis not present

## 2014-08-05 DIAGNOSIS — Z8639 Personal history of other endocrine, nutritional and metabolic disease: Secondary | ICD-10-CM | POA: Diagnosis not present

## 2014-08-05 DIAGNOSIS — R0981 Nasal congestion: Secondary | ICD-10-CM | POA: Diagnosis present

## 2014-08-05 MED ORDER — LEVOFLOXACIN 750 MG PO TABS: 750.0000 mg | ORAL_TABLET | Freq: Every day | ORAL | Status: DC

## 2014-08-05 MED ORDER — ALBUTEROL SULFATE HFA 108 (90 BASE) MCG/ACT IN AERS: 2.0000 | INHALATION_SPRAY | RESPIRATORY_TRACT | Status: DC | PRN

## 2014-08-05 MED ORDER — HYDROCODONE-HOMATROPINE 5-1.5 MG/5ML PO SYRP: 5.0000 mL | ORAL_SOLUTION | Freq: Four times a day (QID) | ORAL | Status: DC | PRN

## 2014-08-05 MED ORDER — GUAIFENESIN 100 MG/5ML PO LIQD: 100.0000 mg | ORAL | Status: DC | PRN

## 2014-08-05 NOTE — Discharge Instructions (Signed)

## 2014-08-05 NOTE — ED Notes (Addendum)
Pt reports a cough for the past 2 weeks. Reports that he has not had any fever, but reports left rib pain. States that he has been told that he had a problem with his heart pumping right in the past, but is on no medication.

## 2014-08-05 NOTE — ED Provider Notes (Signed)
CSN: [id number]     Arrival date & time 08/05/14  1438 History   First MD Initiated Contact with Patient 08/05/14 1705     Chief Complaint  Patient presents with  . Cough  . Nasal Congestion     (Consider location/radiation/quality/duration/timing/severity/associated sxs/prior Treatment) HPI Comments: Patient presents to the ER for evaluation of cough and chest congestion that has been ongoing for 2 weeks. He reports that the symptoms seemed to improve over the course of the day, but when he wakes up he has significantly worsened cough and mucus production. He has not had any significant shortness of breath. He has not noticed any fever.  Patient is a 80 y.o. male presenting with cough.  Cough Associated symptoms: no shortness of breath     Past Medical History  Diagnosis Date  . Hypertension   . Hypercholesteremia    History reviewed. No pertinent past surgical history. History reviewed. No pertinent family history. History  Substance Use Topics  . Smoking status: Never Smoker   . Smokeless tobacco: Not on file  . Alcohol Use: No    Review of Systems  Respiratory: Positive for cough. Negative for shortness of breath.   All other systems reviewed and are negative.     Allergies  Review of patient's allergies indicates no known allergies.  Home Medications   Prior to Admission medications   Medication Sig Start Date End Date Taking? Authorizing Provider  albuterol (PROVENTIL HFA;VENTOLIN HFA) 108 (90 BASE) MCG/ACT inhaler Inhale 2 puffs into the lungs every 4 (four) hours as needed for wheezing or shortness of breath. 08/05/14   Gilda Creasehristopher J. Atiba Kimberlin, MD  amLODipine (NORVASC) 5 MG tablet Take 1 tablet (5 mg total) by mouth daily. 04/25/14   Abram SanderElena M Adamo, MD  aspirin EC 81 MG tablet Take 1 tablet (81 mg total) by mouth daily. 04/25/14   Abram SanderElena M Adamo, MD  furosemide (LASIX) 20 MG tablet Take 1 tablet (20 mg total) by mouth daily. 04/25/14   Abram SanderElena M Adamo, MD   guaiFENesin (ROBITUSSIN) 100 MG/5ML liquid Take 5-10 mLs (100-200 mg total) by mouth every 4 (four) hours as needed for cough. 08/05/14   Gilda Creasehristopher J. Mykenzie Ebanks, MD  HYDROcodone-homatropine [facility](HYCODAN) 5-1.5 MG/5ML syrup Take 5 mLs by mouth every 6 (six) hours as needed for cough. 08/05/14   Gilda Creasehristopher J. Luceal Hollibaugh, MD  levofloxacin (LEVAQUIN) 750 MG tablet Take 1 tablet (750 mg total) by mouth daily. 08/05/14   Gilda Creasehristopher J. Gawain Crombie, MD  tamsulosin (FLOMAX) 0.4 MG CAPS capsule TAKE ONE CAPSULE BY MOUTH DAILY 04/25/14   Abram SanderElena M Adamo, MD   BP 169/82 mmHg  Pulse 100  Temp(Src) 98.1 F (36.7 C) (Oral)  Resp 22  Ht 5\' 7"  (1.702 m)  Wt 177 lb (80.287 kg)  BMI 27.72 kg/m2  SpO2 98% Physical Exam  Constitutional: He is oriented to person, place, and time. He appears well-developed and well-nourished. No distress.  HENT:  Head: Normocephalic and atraumatic.  Right Ear: Hearing normal.  Left Ear: Hearing normal.  Nose: Nose normal.  Mouth/Throat: Oropharynx is clear and moist and mucous membranes are normal.  Eyes: Conjunctivae and EOM are normal. Pupils are equal, round, and reactive to light.  Neck: Normal range of motion. Neck supple.  Cardiovascular: Regular rhythm, S1 normal and S2 normal.  Exam reveals no gallop and no friction rub.   No murmur heard. Pulmonary/Chest: Effort normal and breath sounds normal. No respiratory distress. He exhibits no tenderness.  Abdominal: Soft. Normal appearance and  bowel sounds are normal. There is no hepatosplenomegaly. There is no tenderness. There is no rebound, no guarding, no tenderness at McBurney's point and negative Murphy's sign. No hernia.  Musculoskeletal: Normal range of motion.  Neurological: He is alert and oriented to person, place, and time. He has normal strength. No cranial nerve deficit or sensory deficit. Coordination normal. GCS eye subscore is 4. GCS verbal subscore is 5. GCS motor subscore is 6.  Skin: Skin is warm, dry and intact. No  rash noted. No cyanosis.  Psychiatric: He has a normal mood and affect. His speech is normal and behavior is normal. Thought content normal.  Nursing note and vitals reviewed.   ED Course  Procedures (including critical care time) Labs Review Labs Reviewed - No data to display  Imaging Review Dg Chest 2 View (if Patient Has Fever And/or Copd)  08/05/2014   CLINICAL DATA:  Cough for 2 weeks, history hypertension  EXAM: CHEST  2 VIEW  COMPARISON:  12/22/2008  FINDINGS: Normal heart size and pulmonary vascularity.  Calcified tortuous thoracic aorta.  New RIGHT upper lobe infiltrate consistent with pneumonia.  Chronic bronchitic changes with minimal RIGHT basilar atelectasis.  No pleural effusion or pneumothorax.  Bones unremarkable.  IMPRESSION: Chronic bronchitic changes with new RIGHT upper lobe infiltrate consistent with pneumonia.  Followup until resolution recommended to exclude underlying abnormalities.   Electronically Signed   By: Ulyses SouthwardMark  Boles M.D.   On: 08/05/2014 17:08     EKG Interpretation None      MDM   Final diagnoses:  CAP (community acquired pneumonia)   Patient presents to the ER for evaluation of cough that has been ongoing for 2 weeks. Chest x-ray shows evidence of pneumonia. Patient is in no distress. Oxygenation is 97-98% on room air. He is breathing comfortably. Vital signs are all normal. He doesn't require hospitalization for this, is appropriate for outpatient management. He was given strict return precautions for worsening symptoms.    Gilda Creasehristopher J. Daveyon Kitchings, MD 08/05/14 1726

## 2014-08-05 NOTE — ED Notes (Signed)
Pt states that congestion, cough, and drainage are worse in the morning after he wakes up.

## 2014-12-08 ENCOUNTER — Ambulatory Visit (INDEPENDENT_AMBULATORY_CARE_PROVIDER_SITE_OTHER): Payer: Medicare Other | Admitting: Family Medicine

## 2014-12-08 ENCOUNTER — Encounter: Payer: Self-pay | Admitting: Family Medicine

## 2014-12-08 VITALS — BP 161/89 | HR 51 | Temp 97.8°F | Ht 67.0 in | Wt 172.5 lb

## 2014-12-08 DIAGNOSIS — T1591XA Foreign body on external eye, part unspecified, right eye, initial encounter: Secondary | ICD-10-CM | POA: Diagnosis not present

## 2014-12-08 DIAGNOSIS — T1501XA Foreign body in cornea, right eye, initial encounter: Secondary | ICD-10-CM | POA: Diagnosis not present

## 2014-12-08 DIAGNOSIS — T1590XA Foreign body on external eye, part unspecified, unspecified eye, initial encounter: Secondary | ICD-10-CM | POA: Insufficient documentation

## 2014-12-08 NOTE — Patient Instructions (Signed)
Eye, Foreign Body A foreign body is an object that should not be there. The object could be near, on, or in the eye. HOME CARE  Keep the eye closed as much as possible.  Do not rub the eye.  Wear dark glasses in bright light.  Do not wear contact lenses until the eye feels normal, or as told by your doctor.  Wear protective eye covering, especially when using high speed tools.  Only take medicine as told by your doctor.  GET HELP RIGHT AWAY IF:   Your pain gets worse.  Your vision changes.  You have problems with the eye patch.  The injury gets larger.  There is fluid (discharge) coming from the eye.  You get puffiness (swelling) and soreness.  You have an oral temperature above 102 F (38.9 C), not controlled by medicine.  MAKE SURE YOU:   Understand these instructions.  Will watch your condition.  Will get help right away if you are not doing well or get worse. Document Released: 01/19/2010 Document Revised: 10/24/2011 Document Reviewed: 12/27/2012 [facility] Patient Information 2015 [place] [place]. This information is not intended to replace advice given to you by your health care provider. Make sure you discuss any questions you have with your health care provider.

## 2014-12-08 NOTE — Progress Notes (Signed)
   Subjective:    Patient ID: Paul [name], male    DOB: 03/05/1927, 81 y.o.   MRN: [id number]  HPI Pt presents for foreign body in his right eye for 3 days. 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 He hoped it would come out on its own but it has continued to become more painful. It now has some redness and discharge. No fever, purulence, changes in vision, pain with EOM, not light sensitive.   Review of Systems See HPI    Objective:   Physical Exam  Constitutional: He is oriented to person, place, and time. He appears well-developed and well-nourished. No distress.  HENT:  Head: Normocephalic and atraumatic.  Eyes: EOM are normal. Pupils are equal, round, and reactive to light. Right eye exhibits discharge. Foreign body present in the right eye. Left eye exhibits no discharge. Right conjunctiva is injected. Left conjunctiva is not injected. No scleral icterus.    Cardiovascular: Normal rate.   No murmur heard. Pulmonary/Chest: Effort normal. No respiratory distress.  Abdominal: He exhibits no distension.  Neurological: He is alert and oriented to person, place, and time.  Skin: Skin is warm and dry. No rash noted. He is not diaphoretic.  Psychiatric: He has a normal mood and affect. His behavior is normal.  Nursing note and vitals reviewed.         Assessment & Plan:

## 2014-12-08 NOTE — Assessment & Plan Note (Signed)
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

## 2014-12-09 NOTE — Progress Notes (Signed)
Patient scheduled for urgent eye appt today at 10:00 am to check for foreign body.  Does not drive and uses bus for transportation.  Due to short notice and patient's limited availability to get to eye appt, will provide patient with taxi voucher for [id number] [address]  Copy of voucher placed in [employer] box.  Altamese Dilling~Garald Rhew, BSN, RN-BC

## 2015-02-19 ENCOUNTER — Encounter: Payer: Self-pay | Admitting: Family Medicine

## 2015-02-19 ENCOUNTER — Ambulatory Visit (INDEPENDENT_AMBULATORY_CARE_PROVIDER_SITE_OTHER): Payer: Medicare Other | Admitting: Family Medicine

## 2015-02-19 VITALS — BP 135/59 | HR 77 | Temp 98.6°F | Wt 171.6 lb

## 2015-02-19 DIAGNOSIS — R109 Unspecified abdominal pain: Secondary | ICD-10-CM

## 2015-02-19 NOTE — Patient Instructions (Signed)
Thanks for coming in today!   I think that your feeling is most likely from gas.   You should eat 2-3 oranges each day to help your bowels move well.   Thanks for letting us take care of you!   Sincerely,  Devota Pacealeb Lemmie Vanlanen, MD Family Medicine - PGY 2

## 2015-02-23 NOTE — Progress Notes (Signed)
Patient ID: Paul KelpRobert [name], male   DOB: 08/06/1927, [age] y.o.   MRN: [id number]   [facility] Yolande Jollyaleb G Sheehan Stacey, MD Phone: [phone]  Subjective:   # Left Sided Bubbling Feeling - Pt. Here with complaint of "odd bubbling" on left side.  - He is "very in tune with his body".  - He says that he has had an intermittent feeling of gurgling on the left side over the past two weeks.  - No pain, nausea, vomiting, fever, chills, sweats, weight loss, melena, diarrhea, hematochezia.  - He has not had any change in his bowel habits.  - He eats an excellent diet with lots of fiber.  - He has not had any reflux.  - He denies chest pain, SOB, arm pain, neck pain, jaw pain.  - He has not had any trauma to the area.  - He has not noticed any burning sensation or rashes.  - Colon screening up to date.   All relevant systems were reviewed and were negative unless otherwise noted in the HPI  Past Medical History Reviewed problem list.  Medications- reviewed and updated Current Outpatient Prescriptions  Medication Sig Dispense Refill  . albuterol (PROVENTIL HFA;VENTOLIN HFA) 108 (90 BASE) MCG/ACT inhaler Inhale 2 puffs into the lungs every 4 (four) hours as needed for wheezing or shortness of breath. 1 Inhaler 0  . amLODipine (NORVASC) 5 MG tablet Take 1 tablet (5 mg total) by mouth daily. 90 tablet 3  . aspirin EC 81 MG tablet Take 1 tablet (81 mg total) by mouth daily. 90 tablet 4  . furosemide (LASIX) 20 MG tablet Take 1 tablet (20 mg total) by mouth daily. 90 tablet 4  . guaiFENesin (ROBITUSSIN) 100 MG/5ML liquid Take 5-10 mLs (100-200 mg total) by mouth every 4 (four) hours as needed for cough. 60 mL 0  . HYDROcodone-homatropine (HYCODAN) 5-1.5 MG/5ML syrup Take 5 mLs by mouth every 6 (six) hours as needed for cough. 75 mL 0  . levofloxacin (LEVAQUIN) 750 MG tablet Take 1 tablet (750 mg total) by mouth daily. 5 tablet 0  . tamsulosin (FLOMAX) 0.4 MG CAPS capsule TAKE ONE CAPSULE  BY MOUTH DAILY 90 capsule 4   No current facility-administered medications for this visit.   Chief complaint-noted No additions to family history Social history- patient is a non smoker  Objective: BP 135/59 mmHg  Pulse 77  Temp(Src) 98.6 F (37 C)  Wt 171 lb 9.6 oz (77.837 kg) Gen: NAD, alert, cooperative with exam HEENT: NCAT, EOMI, PERRL Neck: FROM, supple CV: RRR, good S1/S2, no murmur Resp: CTABL, no wheezes, non-labored Abd: SNTND, BS present, no guarding or organomegaly, No left sided tenderness. No splenomegaly. Some slightly increased BS on the left. Otherwise no masses palpable.  Ext: No edema, warm, normal tone, moves UE/LE spontaneously Neuro: Alert and oriented, No gross deficits Skin: no rashes no lesions  Assessment/Plan:  # Delayed Bowel Transit - Pt. With likely variation in bowel function at this time. He has no history or exam concerning for MI, Zoster, or obstruction. He has an excellent diet and his colon screening is up to date.  - Follow up prn - Continue to use high fiber diet.  - If symptoms persist or worsen return for evaluation or to the ED.  - Follow up with PCP as previously scheduled.

## 2015-02-25 ENCOUNTER — Telehealth: Payer: Self-pay | Admitting: Family Medicine

## 2015-02-25 NOTE — Telephone Encounter (Signed)
Left message on voicemail for patient to return call with more details.

## 2015-02-25 NOTE — Telephone Encounter (Signed)
Pt is still having the same problem as last yr and it is not getting beter Would like to have an xray or CT scan to see if there is a tumor there Please advise

## 2015-02-26 NOTE — Telephone Encounter (Signed)
Left another message for patient to call back 

## 2015-02-27 NOTE — Telephone Encounter (Signed)
Left message, will await callback from patient.

## 2015-12-14 ENCOUNTER — Ambulatory Visit (INDEPENDENT_AMBULATORY_CARE_PROVIDER_SITE_OTHER): Payer: Medicare Other | Admitting: Family Medicine

## 2015-12-14 VITALS — BP 179/59 | HR 57 | Temp 98.5°F | Wt 166.4 lb

## 2015-12-14 DIAGNOSIS — I10 Essential (primary) hypertension: Secondary | ICD-10-CM

## 2015-12-14 DIAGNOSIS — R059 Cough, unspecified: Secondary | ICD-10-CM

## 2015-12-14 DIAGNOSIS — R05 Cough: Secondary | ICD-10-CM | POA: Insufficient documentation

## 2015-12-14 MED ORDER — AMLODIPINE BESYLATE 5 MG PO TABS: 5.0000 mg | ORAL_TABLET | Freq: Every day | ORAL | Status: DC

## 2015-12-14 NOTE — Assessment & Plan Note (Signed)
Likely related to viral URI vs allergic rhinitis with postnasal drip Reassured patient, discussed symptomatic management, natural time course, and return precautions F/u prn

## 2015-12-14 NOTE — Progress Notes (Signed)
   Subjective:   Paul [name] is a 82 y.o. male with a history of HFpEF, BPH, HLD, Obesity, GERD here for same day appt for cough  COUGH  Has been coughing for 3 days. Cough is: deep and productive Sputum production: light yellow Medications tried: none  Taking blood pressure medications: no, supposed to be taking amlodipine 5mg  but ran out >2 months ago  Symptoms Runny nose: yes, thin clear Mucous in back of throat: yes Sore throat initially Throat burning or reflux: no Wheezing or asthma: no Fever: subjective warmth at bedtime, self-resolves Chest Pain: no Shortness of breath: no Leg swelling: no Hemoptysis: no Weight loss: no  Wife diagnosed by ED with "light PNA" somewhat recently - he doesn't know any details because she doesn't tell him anything  Review of Systems:  Per HPI.   Social History: never smoker - passive smoke exposure  Objective:  BP 173/59 mmHg  Pulse 57  Temp(Src) 98.5 F (36.9 C) (Oral)  Wt 166 lb 6.4 oz (75.479 kg)  SpO2 97%  Gen:  82 y.o. male in NAD HEENT: NCAT, MMM, EOMI, PERRL, anicteric sclerae, OP clear, TMs clear b/l CV: RRR, no MRG Resp: Non-labored, CTAB, no wheezes noted Ext: WWP, no edema MSK: No obvious deformities Neuro: Alert and oriented, speech normal    Assessment & Plan:     Paul KelpRobert [name] is a 82 y.o. male here for  Essential hypertension, benign Poorly controlled Out of medications for >2 months Asymptomatic Restart amlodipine 5mg  daily F/u with PCP in 2-4 weeks  Cough Likely related to viral URI vs allergic rhinitis with postnasal drip Reassured patient, discussed symptomatic management, natural time course, and return precautions F/u prn    Erasmo DownerAngela M Bacigalupo, MD MPH PGY-2,  [facility] 12/14/2015  9:54 AM

## 2015-12-14 NOTE — Assessment & Plan Note (Signed)
Poorly controlled Out of medications for >2 months Asymptomatic Restart amlodipine 5mg  daily F/u with PCP in 2-4 weeks

## 2015-12-14 NOTE — Patient Instructions (Signed)
I think your cough is related to cold or allergies.  Try nasal saline to clear out nasal mucous.  I refilled your blood pressure pills, so start taking those again.  Follow-up with PCP in 2-4 weeks for blood pressure.  Take care, Dr. BLeonard Schwartz

## 2016-01-08 ENCOUNTER — Ambulatory Visit: Payer: Medicare Other | Admitting: Family Medicine

## 2016-03-18 ENCOUNTER — Other Ambulatory Visit: Payer: Self-pay | Admitting: Family Medicine

## 2016-03-18 DIAGNOSIS — I10 Essential (primary) hypertension: Secondary | ICD-10-CM

## 2016-05-03 ENCOUNTER — Encounter (HOSPITAL_COMMUNITY): Payer: Self-pay | Admitting: *Deleted

## 2016-05-03 ENCOUNTER — Emergency Department (HOSPITAL_COMMUNITY): Payer: Medicare Other

## 2016-05-03 ENCOUNTER — Emergency Department (HOSPITAL_COMMUNITY)
Admission: EM | Admit: 2016-05-03 | Discharge: 2016-05-03 | Disposition: A | Discharge status: Home / Self Care | Payer: Medicare Other | Attending: Emergency Medicine | Admitting: Emergency Medicine

## 2016-05-03 DIAGNOSIS — Y929 Unspecified place or not applicable: Secondary | ICD-10-CM | POA: Insufficient documentation

## 2016-05-03 DIAGNOSIS — R2241 Localized swelling, mass and lump, right lower limb: Secondary | ICD-10-CM | POA: Diagnosis not present

## 2016-05-03 DIAGNOSIS — Y939 Activity, unspecified: Secondary | ICD-10-CM | POA: Insufficient documentation

## 2016-05-03 DIAGNOSIS — I5031 Acute diastolic (congestive) heart failure: Secondary | ICD-10-CM | POA: Insufficient documentation

## 2016-05-03 DIAGNOSIS — Z7982 Long term (current) use of aspirin: Secondary | ICD-10-CM | POA: Insufficient documentation

## 2016-05-03 DIAGNOSIS — M25561 Pain in right knee: Secondary | ICD-10-CM | POA: Diagnosis not present

## 2016-05-03 DIAGNOSIS — Y999 Unspecified external cause status: Secondary | ICD-10-CM | POA: Diagnosis not present

## 2016-05-03 DIAGNOSIS — W228XXA Striking against or struck by other objects, initial encounter: Secondary | ICD-10-CM | POA: Diagnosis not present

## 2016-05-03 DIAGNOSIS — I110 Hypertensive heart disease with heart failure: Secondary | ICD-10-CM | POA: Insufficient documentation

## 2016-05-03 DIAGNOSIS — L989 Disorder of the skin and subcutaneous tissue, unspecified: Secondary | ICD-10-CM | POA: Diagnosis present

## 2016-05-03 NOTE — ED Notes (Signed)
Patient transported to X-ray 

## 2016-05-03 NOTE — ED Triage Notes (Signed)
Pt states injured one week ago to right lower leg and has healed over and concerned for something in his leg

## 2016-05-03 NOTE — Discharge Instructions (Signed)
Read the information below.  Your x-ray was negative for obvious foreign body, dislocation, fracture. I provided the contact information for orthopedic surgery above. Please call to schedule an appointment for further evaluation. If you experience pain you can take Tylenol or Motrin for relief. Any time you come to the emergency department it is recommended that you follow up with your primary care provider. Please call to schedule an appointment. You may return to the Emergency Department at any time for worsening condition or any new symptoms that concern you. Return to the ED if you develop fever, tenderness at knot, lower leg pain and swelling, chest pain, shortness of breath.

## 2016-05-03 NOTE — ED Provider Notes (Signed)
[facility]-EMERGENCY DEPT Provider Note   CSN: [id number] Arrival date & time: 05/03/16  1642  By signing my name below, I, Paul [name], attest that this documentation has been prepared under the direction and in the presence of non-physician practitioner, Arvilla MeresAshley Rollen Selders, PA-C. Electronically Signed: Nelwyn SalisburyJoshua [name], Scribe. 05/05/2016. 2:58 PM.    History   Chief Complaint Chief Complaint  Patient presents with  . Leg Injury   The history is provided by the patient. No language interpreter was used.     HPI Comments:  Claud KelpRobert [name] is a 82 y.o. male with PMHx of HTN who presents to the Emergency Department complaining of sudden-onset resolved right lower leg injury occurring a week ago. Pt states that he was mowing the lawn when a piece of metal bounced off a nearby surface and embedded in his leg. He states that he thought the metal fell out of his leg when he put the bandage on, and is concerned for a potential blood clot. He denies any warmth, swelling, or redness. He denies any recent long distance travel, surgery or hospitalization, hemoptysis, vomiting, leg swelling/pain, CP, fever, joint swelling or SOB. When asked about his PMHx he states he has no history of blood clots or cancer.   Past Medical History:  Diagnosis Date  . Hypercholesteremia   . Hypertension     Patient Active Problem List   Diagnosis Date Noted  . Cough 12/14/2015  . Eye foreign body 12/08/2014  . Acute diastolic heart failure (HCC) 07/09/2013  . Shortness of breath 05/28/2013  . Bright red blood per rectum 05/08/2012  . Low back pain 05/08/2012  . Balance problem 04/24/2012  . Flank pain 02/14/2012  . Erectile dysfunction 02/14/2012  . HIP PAIN, RIGHT 08/17/2010  . PAGET'S DISEASE 08/17/2010  . ANKLE PAIN, LEFT 02/16/2010  . BENIGN PROSTATIC HYPERTROPHY, WITH URINARY OBSTRUCTION 03/09/2009  . URINARY INCONTINENCE 03/09/2009  . HYPERLIPIDEMIA 11/28/2008  . INSOMNIA 11/28/2008  . OBESITY 11/13/2008  .  Essential hypertension, benign 11/13/2008  . GASTROESOPHAGEAL REFLUX DISEASE, MILD 11/13/2008  . CONSTIPATION, CHRONIC 11/13/2008    History reviewed. No pertinent surgical history.     Home Medications    Prior to Admission medications   Medication Sig Start Date End Date Taking? Authorizing Provider  albuterol (PROVENTIL HFA;VENTOLIN HFA) 108 (90 BASE) MCG/ACT inhaler Inhale 2 puffs into the lungs every 4 (four) hours as needed for wheezing or shortness of breath. 08/05/14   Gilda Creasehristopher J Pollina, MD  amLODipine (NORVASC) 5 MG tablet TAKE 1 TABLET BY MOUTH DAILY 03/18/16   Casey BurkittHillary Moen Fitzgerald, MD  aspirin EC 81 MG tablet Take 1 tablet (81 mg total) by mouth daily. 04/25/14   Abram SanderElena M Adamo, MD  furosemide (LASIX) 20 MG tablet Take 1 tablet (20 mg total) by mouth daily. 04/25/14   Abram SanderElena M Adamo, MD  guaiFENesin (ROBITUSSIN) 100 MG/5ML liquid Take 5-10 mLs (100-200 mg total) by mouth every 4 (four) hours as needed for cough. 08/05/14   Gilda Creasehristopher J Pollina, MD  HYDROcodone-homatropine [facility](HYCODAN) 5-1.5 MG/5ML syrup Take 5 mLs by mouth every 6 (six) hours as needed for cough. 08/05/14   Gilda Creasehristopher J Pollina, MD  levofloxacin (LEVAQUIN) 750 MG tablet Take 1 tablet (750 mg total) by mouth daily. 08/05/14   Gilda Creasehristopher J Pollina, MD  tamsulosin (FLOMAX) 0.4 MG CAPS capsule TAKE ONE CAPSULE BY MOUTH DAILY 04/25/14   Abram SanderElena M Adamo, MD    Family History No family history on file.  Social History Social History  Substance  Use Topics  . Smoking status: Never Smoker  . Smokeless tobacco: Never Used  . Alcohol use No     Allergies   Review of patient's allergies indicates no known allergies.   Review of Systems Review of Systems  Constitutional: Negative for fever.  Respiratory: Negative for shortness of breath.   Cardiovascular: Negative for chest pain and leg swelling.       No hemoptysis  Gastrointestinal: Negative for vomiting.  Musculoskeletal: Negative for arthralgias and  joint swelling.  Skin: Positive for wound. Negative for color change.     Physical Exam Updated Vital Signs BP 151/66 (BP Location: Right Arm)   Pulse (!) 52   Temp 97.6 F (36.4 C) (Oral)   Resp 18   Ht 5\' 5"  (1.651 m)   Wt 81.6 kg   SpO2 98%   BMI 29.95 kg/m   Physical Exam  Constitutional: He is oriented to person, place, and time. He appears well-developed and well-nourished. No distress.  HENT:  Head: Normocephalic and atraumatic.  Eyes: Conjunctivae are normal. No scleral icterus.  Neck: Normal range of motion.  Cardiovascular: Normal rate, regular rhythm, normal heart sounds and intact distal pulses.   No murmur heard. Pulmonary/Chest: Effort normal and breath sounds normal. No respiratory distress. He has no wheezes. He has no rales.  Abdominal: He exhibits no distension.  Musculoskeletal:  Smooth, firm, mobile mass medial and just below right knee. Well healing wound noted in area. Non-tender. No warmth, erythema appreciated. No TTP, swelling, effusion, warmth, erythema of knee appreciated.  No TTP of posterior calf. No palpable cords. Negative homan's. Strength and sensation intact. Distal pulses intact. Patient is ambulatory.  Neurological: He is alert and oriented to person, place, and time.  Skin: Skin is warm and dry. He is not diaphoretic.  Psychiatric: He has a normal mood and affect. His behavior is normal.  Nursing note and vitals reviewed.    ED Treatments / Results  Labs (all labs ordered are listed, but only abnormal results are displayed) Labs Reviewed - No data to display  EKG  EKG Interpretation None       Radiology Dg Knee Complete 4 Views Right  Result Date: 05/03/2016 CLINICAL DATA:  Painful knot in the medial aspect of the knee. Foreign object struck knee 1 week ago while mowing. EXAM: RIGHT KNEE - COMPLETE 4+ VIEW COMPARISON:  None. FINDINGS: No joint effusion. There is extensive regional vascular calcification. No evidence of  osteoarthritis. No evidence of fracture. No evidence of soft tissue foreign object. IMPRESSION: Negative except for vascular calcification. No specific finding in the medial knee to correlate with the clinical history. Electronically Signed   By: Paulina Fusi M.D.   On: 05/03/2016 19:46    Procedures Procedures (including critical care time)  Medications Ordered in ED Medications - No data to display   Initial Impression / Assessment and Plan / ED Course  I have reviewed the triage vital signs and the nursing notes.  Pertinent labs & imaging results that were available during my care of the patient were reviewed by me and considered in my medical decision making (see chart for details).  Clinical Course  Value Comment By Time  DG Knee Complete 4 Views Right No evidence of fracture or dislocation.  Lona Kettle, [place] 09/19 2000   Patient presents to ED with complaint of knot to right lower extremity. Patient is afebrile and non-toxic appearing in NAD. VSS. Physical Exam remarkable for smooth, firm, mobile mass  medial just below right knee. No tenderness, warmth, erythema appreciated. No tenderness palpation, swelling, effusion, warmth, erythema of the knee. Patient is ambulatory. Sensation and pulses intact. Well's score 0, low suspicion for DVT. X-ray negative for foreign body, dislocation, fracture. Of note, vascular calcification. Discussed results and plan with patient. Will have patient follow up with surgery for further evaluation of possible organic foreign body not detected on x-ray. Return precautions provided. Patient voiced understanding and is agreeable.  Discussed pt with Dr. Dalene Seltzer, who also saw patient, agrees with plan.   Final Clinical Impressions(s) / ED Diagnoses   Final diagnoses:  Lower leg mass, right   I personally performed the services described in this documentation, which was scribed in my presence. The recorded information has been reviewed and is  accurate.  New Prescriptions Discharge Medication List as of 05/03/2016  8:18 PM       Lona Kettle, PA-C 05/05/16 1458    Alvira Monday, MD 05/08/16 2117    Alvira Monday, MD 05/08/16 2122

## 2016-05-07 ENCOUNTER — Encounter (HOSPITAL_COMMUNITY): Payer: Self-pay

## 2016-05-07 ENCOUNTER — Emergency Department (HOSPITAL_BASED_OUTPATIENT_CLINIC_OR_DEPARTMENT_OTHER)
Admission: RE | Admit: 2016-05-07 | Discharge: 2016-05-07 | Disposition: A | Discharge status: Home / Self Care | Payer: Medicare Other | Source: Ambulatory Visit | Attending: Emergency Medicine | Admitting: Emergency Medicine

## 2016-05-07 ENCOUNTER — Emergency Department (HOSPITAL_COMMUNITY)
Admission: EM | Admit: 2016-05-07 | Discharge: 2016-05-07 | Disposition: A | Discharge status: Home / Self Care | Payer: Medicare Other | Attending: Emergency Medicine | Admitting: Emergency Medicine

## 2016-05-07 DIAGNOSIS — I10 Essential (primary) hypertension: Secondary | ICD-10-CM | POA: Diagnosis not present

## 2016-05-07 DIAGNOSIS — Z7982 Long term (current) use of aspirin: Secondary | ICD-10-CM | POA: Diagnosis not present

## 2016-05-07 DIAGNOSIS — M79609 Pain in unspecified limb: Secondary | ICD-10-CM

## 2016-05-07 DIAGNOSIS — L02419 Cutaneous abscess of limb, unspecified: Secondary | ICD-10-CM

## 2016-05-07 DIAGNOSIS — M65061 Abscess of tendon sheath, right lower leg: Secondary | ICD-10-CM | POA: Insufficient documentation

## 2016-05-07 DIAGNOSIS — M79604 Pain in right leg: Secondary | ICD-10-CM | POA: Diagnosis present

## 2016-05-07 DIAGNOSIS — L02415 Cutaneous abscess of right lower limb: Secondary | ICD-10-CM | POA: Diagnosis not present

## 2016-05-07 DIAGNOSIS — Z79899 Other long term (current) drug therapy: Secondary | ICD-10-CM | POA: Diagnosis not present

## 2016-05-07 MED ORDER — SULFAMETHOXAZOLE-TRIMETHOPRIM 800-160 MG PO TABS: 1.0000 | ORAL_TABLET | Freq: Two times a day (BID) | ORAL | 0 refills | Status: AC

## 2016-05-07 MED ORDER — CEPHALEXIN 500 MG PO CAPS: 500.0000 mg | ORAL_CAPSULE | Freq: Four times a day (QID) | ORAL | 0 refills | Status: DC

## 2016-05-07 MED ORDER — LIDOCAINE HCL (PF) 1 % IJ SOLN
30.0000 mL | Freq: Once | INTRAMUSCULAR | Status: DC
  Filled 2016-05-07: qty 30

## 2016-05-07 NOTE — Progress Notes (Signed)
VASCULAR LAB PRELIMINARY  PRELIMINARY  PRELIMINARY  PRELIMINARY  Right lower extremity venous duplex completed.    Preliminary report:  There is no DVT or SVT noted in the right lower extremity.   Cale Decarolis, RVT 05/07/2016, 12:53 PM

## 2016-05-07 NOTE — Discharge Instructions (Signed)
Take the antibiotics as prescribed. Follow up to have your wound checked again in 2 days. Return to the ED if you develop new or worsening symptoms.

## 2016-05-07 NOTE — ED Provider Notes (Signed)
[facility]-EMERGENCY DEPT Provider Note   CSN: [id number] Arrival date & time: 05/07/16  1056     History   Chief Complaint Chief Complaint  Patient presents with  . Leg Pain    HPI Paul [name] is a 82 y.o. male.  Patient complains of ongoing right medial leg pain after being struck by a foreign object in the morning his lawn last week. He was seen in the ED had a negative x-ray on 9/19 for foreign body. He reports ongoing pain and swelling larger. Denies any fever, vomiting, bleeding or drainage. Denies chest pain or shortness of breath.   The history is provided by the patient.  Leg Pain      Past Medical History:  Diagnosis Date  . Hypercholesteremia   . Hypertension     Patient Active Problem List   Diagnosis Date Noted  . Cough 12/14/2015  . Eye foreign body 12/08/2014  . Acute diastolic heart failure (HCC) 07/09/2013  . Shortness of breath 05/28/2013  . Bright red blood per rectum 05/08/2012  . Low back pain 05/08/2012  . Balance problem 04/24/2012  . Flank pain 02/14/2012  . Erectile dysfunction 02/14/2012  . HIP PAIN, RIGHT 08/17/2010  . PAGET'S DISEASE 08/17/2010  . ANKLE PAIN, LEFT 02/16/2010  . BENIGN PROSTATIC HYPERTROPHY, WITH URINARY OBSTRUCTION 03/09/2009  . URINARY INCONTINENCE 03/09/2009  . HYPERLIPIDEMIA 11/28/2008  . INSOMNIA 11/28/2008  . OBESITY 11/13/2008  . Essential hypertension, benign 11/13/2008  . GASTROESOPHAGEAL REFLUX DISEASE, MILD 11/13/2008  . CONSTIPATION, CHRONIC 11/13/2008    History reviewed. No pertinent surgical history.     Home Medications    Prior to Admission medications   Medication Sig Start Date End Date Taking? Authorizing Provider  albuterol (PROVENTIL HFA;VENTOLIN HFA) 108 (90 BASE) MCG/ACT inhaler Inhale 2 puffs into the lungs every 4 (four) hours as needed for wheezing or shortness of breath. 08/05/14   [name], MD  amLODipine (NORVASC) 5 MG tablet TAKE 1 TABLET BY MOUTH DAILY 03/18/16   [name], MD  aspirin EC 81 MG tablet Take 1 tablet (81 mg total) by mouth daily. 04/25/14   [name], MD  furosemide (LASIX) 20 MG tablet Take 1 tablet (20 mg total) by mouth daily. 04/25/14   [name], MD  guaiFENesin (ROBITUSSIN) 100 MG/5ML liquid Take 5-10 mLs (100-200 mg total) by mouth every 4 (four) hours as needed for cough. 08/05/14   [name], MD  HYDROcodone-homatropine [facility]) 5-1.5 MG/5ML syrup Take 5 mLs by mouth every 6 (six) hours as needed for cough. 08/05/14   [name], MD  levofloxacin (LEVAQUIN) 750 MG tablet Take 1 tablet (750 mg total) by mouth daily. 08/05/14   [name], MD  tamsulosin (FLOMAX) 0.4 MG CAPS capsule TAKE ONE CAPSULE BY MOUTH DAILY 04/25/14   [name], MD    Family History No family history on file.  Social History Social History  Substance Use Topics  . Smoking status: Never Smoker  . Smokeless tobacco: Never Used  . Alcohol use No     Allergies   Review of patient's allergies indicates no known allergies.   Review of Systems Review of Systems  Constitutional: Negative for activity change.  Respiratory: Negative for shortness of breath and wheezing.   Cardiovascular: Negative for chest pain.  Gastrointestinal: Negative for abdominal pain, nausea and vomiting.  Genitourinary: Negative for dysuria and hematuria.  Musculoskeletal: Positive for joint swelling. Negative for arthralgias and myalgias.  Neurological:  Negative for dizziness, weakness, light-headedness and headaches.   A complete 10 system review of systems was obtained and all systems are negative except as noted in the HPI and PMH.    Physical Exam Updated Vital Signs BP 137/69 (BP Location: Right Arm)   Pulse 65   Temp 97.7 F (36.5 C) (Oral)   Resp 16   SpO2 98%   Physical Exam  Constitutional: He is oriented to person, place, and time. He appears well-developed and well-nourished. No distress.  HENT:  Head:  Normocephalic and atraumatic.  Mouth/Throat: Oropharynx is clear and moist. No oropharyngeal exudate.  Eyes: Conjunctivae and EOM are normal. Pupils are equal, round, and reactive to light.  Neck: Normal range of motion. Neck supple.  No meningismus.  Cardiovascular: Normal rate, regular rhythm, normal heart sounds and intact distal pulses.   No murmur heard. Pulmonary/Chest: Effort normal and breath sounds normal. No respiratory distress.  Abdominal: Soft. There is no tenderness. There is no rebound and no guarding.  Musculoskeletal: Normal range of motion. He exhibits no edema or tenderness.  Healing wound with induration to right medial lower leg just below knee. Small pustule to inferior aspect of wound.There is no fluctuance or erythema. There is no palpable foreign body. No calf tenderness  Neurological: He is alert and oriented to person, place, and time. No cranial nerve deficit. He exhibits normal muscle tone. Coordination normal.  No ataxia on finger to nose bilaterally. No pronator drift. 5/5 strength throughout. CN 2-12 intact.Equal grip strength. Sensation intact.   Skin: Skin is warm.  Psychiatric: He has a normal mood and affect. His behavior is normal.  Nursing note and vitals reviewed.    ED Treatments / Results  Labs (all labs ordered are listed, but only abnormal results are displayed) Labs Reviewed - No data to display  EKG  EKG Interpretation None       Radiology No results found.  Procedures Procedures (including critical care time)  Medications Ordered in ED Medications - No data to display   Initial Impression / Assessment and Plan / ED Course  I have reviewed the triage vital signs and the nursing notes.  Pertinent labs & imaging results that were available during my care of the patient were reviewed by me and considered in my medical decision making (see chart for details).  Clinical Course  Pain and swelling to Lower leg.  Patient concerned  with foreign body but none seen on recent Xray.  US shows no DVT. Does show small possible abscess with cellulitis.  I+D performed.  Antibiotics started Wound check in 48 hours.  EMERGENCY DEPARTMENT US SOFT TISSUE INTERPRETATION "Study: Limited Ultrasound of the noted body part in comments below"  INDICATIONS: Pain Multiple views of the body part are obtained with a multi-frequency linear probe  PERFORMED BY:  Myself  IMAGES ARCHIVED?: Yes  SIDE:Right   BODY PART:Lower extremity  FINDINGS: abscess with cellulitis  LIMITATIONS:  Emergent Procedure  INTERPRETATION:  Abscess with cellulitis  COMMENT:  No FB seen.  INCISION AND DRAINAGE Performed by: Glynn Octave Consent: Verbal consent obtained. Risks and benefits: risks, benefits and alternatives were discussed Type: abscess  Body area: R leg  Anesthesia: local infiltration  Incision was made with a scalpel.  Local anesthetic: lidocaine 1% without epinephrine  Anesthetic total: 4 ml  Complexity: complex Blunt dissection to break up loculations  Drainage: purulent  Drainage amount: small  Packing material: 1/4 in iodoform gauze  Patient tolerance: Patient tolerated the procedure  well with no immediate complications.    Final Clinical Impressions(s) / ED Diagnoses   Final diagnoses:  Leg abscess    New Prescriptions New Prescriptions   No medications on file     Glynn OctaveStephen Lainee Lehrman, MD 05/07/16 1729

## 2016-05-07 NOTE — ED Triage Notes (Signed)
Patient complains of ongoing right leg soreness after a foreign object flew out of lawnmower last week, seen and had xray in ED for same but wants the object removed, NAD. Skin has healed on leg

## 2016-05-07 NOTE — ED Notes (Signed)
Patient transported to Ultrasound 

## 2016-05-07 NOTE — ED Notes (Signed)
Pt is in stable condition upon d/c and ambulates from ED. 

## 2016-05-09 ENCOUNTER — Encounter (HOSPITAL_COMMUNITY): Payer: Self-pay | Admitting: Emergency Medicine

## 2016-05-09 ENCOUNTER — Emergency Department (HOSPITAL_COMMUNITY)
Admission: EM | Admit: 2016-05-09 | Discharge: 2016-05-09 | Disposition: A | Discharge status: Home / Self Care | Payer: Medicare Other | Attending: Emergency Medicine | Admitting: Emergency Medicine

## 2016-05-09 DIAGNOSIS — Z79899 Other long term (current) drug therapy: Secondary | ICD-10-CM | POA: Insufficient documentation

## 2016-05-09 DIAGNOSIS — I110 Hypertensive heart disease with heart failure: Secondary | ICD-10-CM | POA: Diagnosis not present

## 2016-05-09 DIAGNOSIS — Z4800 Encounter for change or removal of nonsurgical wound dressing: Secondary | ICD-10-CM | POA: Diagnosis not present

## 2016-05-09 DIAGNOSIS — Z4801 Encounter for change or removal of surgical wound dressing: Secondary | ICD-10-CM | POA: Diagnosis not present

## 2016-05-09 DIAGNOSIS — Z7982 Long term (current) use of aspirin: Secondary | ICD-10-CM | POA: Insufficient documentation

## 2016-05-09 DIAGNOSIS — I5031 Acute diastolic (congestive) heart failure: Secondary | ICD-10-CM | POA: Diagnosis not present

## 2016-05-09 DIAGNOSIS — Z5189 Encounter for other specified aftercare: Secondary | ICD-10-CM

## 2016-05-09 NOTE — Discharge Instructions (Signed)
Keep your wounds clean using Dial antibacterial soap and water, pat dry. Continue taking both of your antibiotics until you have completed the prescription. You may also take ibuprofen or Tylenol as prescribed over-the-counter as needed for pain relief. Follow-up with your family doctor at your next scheduled appointment. Return to the emergency department if symptoms worsen or new onset of fever, redness, swelling, warmth, drainage, numbness, tingling, weakness.

## 2016-05-09 NOTE — ED Notes (Signed)
Declined W/C at D/C and was escorted to lobby by RN. 

## 2016-05-09 NOTE — ED Triage Notes (Signed)
Pt here to have packing removed from right leg

## 2016-05-09 NOTE — ED Provider Notes (Signed)
[facility]-EMERGENCY DEPT Provider Note   CSN: [id number] Arrival date & time: 05/09/16  1191  By signing my name below, I, Paul [name], attest that this documentation has been prepared under the direction and in the presence of Paul [name], [place]. Electronically Signed: Sandrea [name], ED Scribe. 05/09/16. 11:00 AM.    History   Chief Complaint Chief Complaint  Patient presents with  . Wound Check    HPI Comments: Paul [name] is a 82 y.o. male who presents to the Emergency Department for follow-up and removal of wound packing of an abscess treated in the ED 2 days ago. Per chart review, pt was seen in the ED 2 days ago for an abscess. Chart review shows an ultrasound performed in the ED revealed abscess with cellulitis and pt was treated with I&D and cephalexin and sulfamethoxazole/trimethoprim. Pt says he has been compliant on his medications. Pt denies pain or fever. Pt has PMHx of HTN. Pt denies history of DM.   The history is provided by the patient. No language interpreter was used.    Past Medical History:  Diagnosis Date  . Hypercholesteremia   . Hypertension     Patient Active Problem List   Diagnosis Date Noted  . Cough 12/14/2015  . Eye foreign body 12/08/2014  . Acute diastolic heart failure (HCC) 07/09/2013  . Shortness of breath 05/28/2013  . Bright red blood per rectum 05/08/2012  . Low back pain 05/08/2012  . Balance problem 04/24/2012  . Flank pain 02/14/2012  . Erectile dysfunction 02/14/2012  . HIP PAIN, RIGHT 08/17/2010  . PAGET'S DISEASE 08/17/2010  . ANKLE PAIN, LEFT 02/16/2010  . BENIGN PROSTATIC HYPERTROPHY, WITH URINARY OBSTRUCTION 03/09/2009  . URINARY INCONTINENCE 03/09/2009  . HYPERLIPIDEMIA 11/28/2008  . INSOMNIA 11/28/2008  . OBESITY 11/13/2008  . Essential hypertension, benign 11/13/2008  . GASTROESOPHAGEAL REFLUX DISEASE, MILD 11/13/2008  . CONSTIPATION, CHRONIC 11/13/2008    History reviewed. No pertinent surgical  history.     Home Medications    Prior to Admission medications   Medication Sig Start Date End Date Taking? Authorizing Provider  albuterol (PROVENTIL HFA;VENTOLIN HFA) 108 (90 BASE) MCG/ACT inhaler Inhale 2 puffs into the lungs every 4 (four) hours as needed for wheezing or shortness of breath. Patient not taking: Reported on 05/07/2016 08/05/14   Paul Crease, MD  amLODipine (NORVASC) 5 MG tablet TAKE 1 TABLET BY MOUTH DAILY 03/18/16   Paul Burkitt, MD  aspirin EC 81 MG tablet Take 1 tablet (81 mg total) by mouth daily. Patient not taking: Reported on 05/07/2016 04/25/14   Paul Sander, MD  cephALEXin (KEFLEX) 500 MG capsule Take 1 capsule (500 mg total) by mouth 4 (four) times daily. 05/07/16   Paul Octave, MD  furosemide (LASIX) 20 MG tablet Take 1 tablet (20 mg total) by mouth daily. Patient not taking: Reported on 05/07/2016 04/25/14   Paul Sander, MD  guaiFENesin (ROBITUSSIN) 100 MG/5ML liquid Take 5-10 mLs (100-200 mg total) by mouth every 4 (four) hours as needed for cough. Patient not taking: Reported on 05/07/2016 08/05/14   Paul Crease, MD  HYDROcodone-homatropine [facility]) 5-1.5 MG/5ML syrup Take 5 mLs by mouth every 6 (six) hours as needed for cough. Patient not taking: Reported on 05/07/2016 08/05/14   Paul Crease, MD  sulfamethoxazole-trimethoprim (BACTRIM DS,SEPTRA DS) 800-160 MG tablet Take 1 tablet by mouth 2 (two) times daily. 05/07/16 05/14/16  Paul Octave, MD  tamsulosin (FLOMAX) 0.4 MG CAPS capsule TAKE ONE CAPSULE BY MOUTH DAILY  Patient not taking: Reported on 05/07/2016 04/25/14   Paul SanderElena M Adamo, MD    Family History History reviewed. No pertinent family history.  Social History Social History  Substance Use Topics  . Smoking status: Never Smoker  . Smokeless tobacco: Never Used  . Alcohol use No     Allergies   Review of patient's allergies indicates no known allergies.   Review of Systems Review of Systems   Constitutional: Negative for fever.  Skin: Positive for wound.     Physical Exam Updated Vital Signs BP 152/63 (BP Location: Right Arm)   Pulse 65   Temp 97.9 F (36.6 C) (Oral)   Resp 18   SpO2 97%   Physical Exam  Constitutional: He is oriented to person, place, and time. He appears well-developed and well-nourished.  HENT:  Head: Normocephalic and atraumatic.  Eyes: Conjunctivae and EOM are normal. Right eye exhibits no discharge. Left eye exhibits no discharge. No scleral icterus.  Cardiovascular: Normal rate.   Pulmonary/Chest: Effort normal.  Musculoskeletal:  Full ROM of right lower extremity Sensation grossly intact 2+ posterior tibial pulse Pt able to stand and ambulate  Neurological: He is alert and oriented to person, place, and time.  Skin: Skin is warm and dry.  Healing 1 cm incision noted to medial aspect of right lower leg with serosanguinous, soaked packing in place. No surrounding swelling, erythema, warmth, or fluctuance noted.  Psychiatric: He has a normal mood and affect.  Nursing note and vitals reviewed.    ED Treatments / Results   DIAGNOSTIC STUDIES: Oxygen Saturation is 97% on RA, adequate by my interpretation.    COORDINATION OF CARE: 10:55 AM Discussed treatment plan with pt at bedside and pt agreed to plan.   Labs (all labs ordered are listed, but only abnormal results are displayed) Labs Reviewed - No data to display  EKG  EKG Interpretation None       Radiology No results found.  Procedures Procedures (including critical care time)  Medications Ordered in ED Medications - No data to display   Initial Impression / Assessment and Plan / ED Course  I have reviewed the triage vital signs and the nursing notes.  Pertinent labs & imaging results that were available during my care of the patient were reviewed by me and considered in my medical decision making (see chart for details).  Clinical Course    Patient presents  for wound recheck and removing of packing. He was seen in the ED 2 days ago and was diagnosed with leg abscess with cellulitis and was started on keflex and bactrim. Denies fever or drainage. VSS. Exam revealed well healing open wound with packing in place. No signs of cellulitis. Packing removed without any complications. Dressing applied. Advised patient to continue taking his antibiotics until completed. Discussed wound care. Advised patient to follow up with PCP as needed. Discussed return precautions.  Final Clinical Impressions(s) / ED Diagnoses   Final diagnoses:  Visit for wound check    New Prescriptions New Prescriptions   No medications on file   I personally performed the services described in this documentation, which was scribed in my presence. The recorded information has been reviewed and is accurate.      Satira Sarkicole Elizabeth CacheNadeau, [place]-C 05/09/16 1628    Rolland PorterMark James, MD 05/13/16 [phone]

## 2016-06-13 ENCOUNTER — Ambulatory Visit (INDEPENDENT_AMBULATORY_CARE_PROVIDER_SITE_OTHER): Payer: Medicare Other | Admitting: Internal Medicine

## 2016-06-13 ENCOUNTER — Encounter: Payer: Self-pay | Admitting: Internal Medicine

## 2016-06-13 VITALS — BP 130/83 | Temp 97.6°F | Wt 178.6 lb

## 2016-06-13 DIAGNOSIS — R229 Localized swelling, mass and lump, unspecified: Secondary | ICD-10-CM

## 2016-06-13 DIAGNOSIS — Z23 Encounter for immunization: Secondary | ICD-10-CM

## 2016-06-13 DIAGNOSIS — I10 Essential (primary) hypertension: Secondary | ICD-10-CM

## 2016-06-13 DIAGNOSIS — M889 Osteitis deformans of unspecified bone: Secondary | ICD-10-CM

## 2016-06-13 DIAGNOSIS — IMO0002 Reserved for concepts with insufficient information to code with codable children: Secondary | ICD-10-CM

## 2016-06-13 DIAGNOSIS — I5031 Acute diastolic (congestive) heart failure: Secondary | ICD-10-CM | POA: Diagnosis not present

## 2016-06-13 DIAGNOSIS — M79622 Pain in left upper arm: Secondary | ICD-10-CM

## 2016-06-13 LAB — CBC WITH DIFFERENTIAL/PLATELET
BASOS PCT: 1 %
Basophils Absolute: 53 cells/uL (ref 0–200)
EOS ABS: 159 {cells}/uL (ref 15–500)
Eosinophils Relative: 3 %
HEMATOCRIT: 46.8 % (ref 38.5–50.0)
HEMOGLOBIN: 15.6 g/dL (ref 13.2–17.1)
LYMPHS ABS: 1855 {cells}/uL (ref 850–3900)
LYMPHS PCT: 35 %
MCH: 31.9 pg (ref 27.0–33.0)
MCHC: 33.3 g/dL (ref 32.0–36.0)
MCV: 95.7 fL (ref 80.0–100.0)
MONO ABS: 477 {cells}/uL (ref 200–950)
MPV: 11.1 fL (ref 7.5–12.5)
Monocytes Relative: 9 %
NEUTROS PCT: 52 %
Neutro Abs: 2756 cells/uL (ref 1500–7800)
Platelets: 220 10*3/uL (ref 140–400)
RBC: 4.89 MIL/uL (ref 4.20–5.80)
RDW: 13.3 % (ref 11.0–15.0)
WBC: 5.3 10*3/uL (ref 3.8–10.8)

## 2016-06-13 LAB — RENAL FUNCTION PANEL
Albumin: 4.4 g/dL (ref 3.6–5.1)
BUN: 13 mg/dL (ref 7–25)
CALCIUM: 9.5 mg/dL (ref 8.6–10.3)
CO2: 24 mmol/L (ref 20–31)
Chloride: 100 mmol/L (ref 98–110)
Creat: 1.2 mg/dL — ABNORMAL HIGH (ref 0.70–1.11)
GLUCOSE: 97 mg/dL (ref 65–99)
Phosphorus: 3.5 mg/dL (ref 2.1–4.3)
Potassium: 4.5 mmol/L (ref 3.5–5.3)
Sodium: 136 mmol/L (ref 135–146)

## 2016-06-13 MED ORDER — AMLODIPINE BESYLATE 5 MG PO TABS: 5.0000 mg | ORAL_TABLET | Freq: Every day | ORAL | 0 refills | Status: DC

## 2016-06-13 MED ORDER — FUROSEMIDE 20 MG PO TABS: 20.0000 mg | ORAL_TABLET | Freq: Every day | ORAL | 4 refills | Status: DC

## 2016-06-13 NOTE — Patient Instructions (Addendum)
[name],  Thank you for coming in today.  I have gotten two labs (blood count and electrolytes) to see why you may be having pain under your left arm. I will call you with the results. It may be a result of having had shingles.  Please continue norvasc. I will advise you about lasix once your lab results come back.  Best, Dr. Sampson GoonFitzgerald  Pneumococcal Conjugate Vaccine (PCV13)  Influenza (Flu) Vaccine (Inactivated or Recombinant):   1. Why get vaccinated? Influenza ("flu") is a contagious disease that spreads around the [place] every year, usually between October and May. Flu is caused by influenza viruses, and is spread mainly by coughing, sneezing, and close contact. Anyone can get flu. Flu strikes suddenly and can last several days. Symptoms vary by age, but can include:  fever/chills  sore throat  muscle aches  fatigue  cough  headache  runny or stuffy nose Flu can also lead to pneumonia and blood infections, and cause diarrhea and seizures in children. If you have a medical condition, such as heart or lung disease, flu can make it worse. Flu is more dangerous for some people. Infants and young children, people 865 years of age and older, pregnant women, and people with certain health conditions or a weakened immune system are at greatest risk. Each year thousands of people in the [place] die from flu, and many more are hospitalized. Flu vaccine can:  keep you from getting flu,  make flu less severe if you do get it, and  keep you from spreading flu to your family and other people. 2. Inactivated and recombinant flu vaccines A dose of flu vaccine is recommended every flu season. Children 6 months through 728 years of age may need two doses during the same flu season. Everyone else needs only one dose each flu season. Some inactivated flu vaccines contain a very small amount of a mercury-based preservative called thimerosal. Studies have not shown thimerosal in  vaccines to be harmful, but flu vaccines that do not contain thimerosal are available. There is no live flu virus in flu shots. They cannot cause the flu. There are many flu viruses, and they are always changing. Each year a new flu vaccine is made to protect against three or four viruses that are likely to cause disease in the upcoming flu season. But even when the vaccine doesn't exactly match these viruses, it may still provide some protection. Flu vaccine cannot prevent:  flu that is caused by a virus not covered by the vaccine, or  illnesses that look like flu but are not. It takes about 2 weeks for protection to develop after vaccination, and protection lasts through the flu season. 3. Some people should not get this vaccine Tell the person who is giving you the vaccine:  If you have any severe, life-threatening allergies. If you ever had a life-threatening allergic reaction after a dose of flu vaccine, or have a severe allergy to any part of this vaccine, you may be advised not to get vaccinated. Most, but not all, types of flu vaccine contain a small amount of egg protein.  If you ever had Guillain-Barre Syndrome (also called GBS). Some people with a history of GBS should not get this vaccine. This should be discussed with your doctor.  If you are not feeling well. It is usually okay to get flu vaccine when you have a mild illness, but you might be asked to come back when you feel better. 4.  Risks of a vaccine reaction With any medicine, including vaccines, there is a chance of reactions. These are usually mild and go away on their own, but serious reactions are also possible. Most people who get a flu shot do not have any problems with it. Minor problems following a flu shot include:  soreness, redness, or swelling where the shot was given  hoarseness  sore, red or itchy eyes  cough  fever  aches  headache  itching  fatigue If these problems occur, they usually begin  soon after the shot and last 1 or 2 days. More serious problems following a flu shot can include the following:  There may be a small increased risk of Guillain-Barre Syndrome (GBS) after inactivated flu vaccine. This risk has been estimated at 1 or 2 additional cases per million people vaccinated. This is much lower than the risk of severe complications from flu, which can be prevented by flu vaccine.  Young children who get the flu shot along with pneumococcal vaccine (PCV13) and/or DTaP vaccine at the same time might be slightly more likely to have a seizure caused by fever. Ask your doctor for more information. Tell your doctor if a child who is getting flu vaccine has ever had a seizure. Problems that could happen after any injected vaccine:  People sometimes faint after a medical procedure, including vaccination. Sitting or lying down for about 15 minutes can help prevent fainting, and injuries caused by a fall. Tell your doctor if you feel dizzy, or have vision changes or ringing in the ears.  Some people get severe pain in the shoulder and have difficulty moving the arm where a shot was given. This happens very rarely.  Any medication can cause a severe allergic reaction. Such reactions from a vaccine are very rare, estimated at about 1 in a million doses, and would happen within a few minutes to a few hours after the vaccination. As with any medicine, there is a very remote chance of a vaccine causing a serious injury or death. The safety of vaccines is always being monitored. For more information, visit: http://floyd.org/ 5. What if there is a serious reaction? What should I look for?  Look for anything that concerns you, such as signs of a severe allergic reaction, very high fever, or unusual behavior. Signs of a severe allergic reaction can include hives, swelling of the face and throat, difficulty breathing, a fast heartbeat, dizziness, and weakness. These would start a few  minutes to a few hours after the vaccination. What should I do?  If you think it is a severe allergic reaction or other emergency that can't wait, call 9-1-1 and get the person to the nearest hospital. Otherwise, call your doctor.  Reactions should be reported to the Vaccine Adverse Event Reporting System (VAERS). Your doctor should file this report, or you can do it yourself through the VAERS web site at www.vaers.LAgents.no, or by calling 1-484 025 7710. VAERS does not give medical advice. 6. The National Vaccine Injury Compensation Program The [employer] Vaccine Injury Compensation Program (VICP) is a federal program that was created to compensate people who may have been injured by certain vaccines. Persons who believe they may have been injured by a vaccine can learn about the program and about filing a claim by calling 1-716-222-2789 or visiting the VICP website at SpiritualWord.at. There is a time limit to file a claim for compensation. 7. How can I learn more?  Ask your healthcare provider. He or she can give  you the vaccine package insert or suggest other sources of information.  Call your local or state health department.  Contact the Centers for Disease Control and Prevention (CDC):  Call (321)088-1719 (1-800-CDC-INFO) or  Visit CDC's website at BiotechRoom.com.cy Vaccine Information Statement Inactivated Influenza Vaccine (03/21/2014)   This information is not intended to replace advice given to you by your health care provider. Make sure you discuss any questions you have with your health care provider.   Document Released: 05/26/2006 Document Revised: 08/22/2014 Document Reviewed: 03/24/2014 Elsevier Interactive Patient Education 2016 [employer].  1. Why get vaccinated? Vaccination can protect both children and adults from pneumococcal disease. Pneumococcal disease is caused by bacteria that can spread from person to person through close contact. It can cause  ear infections, and it can also lead to more serious infections of the:  Lungs (pneumonia),  Blood (bacteremia), and  Covering of the brain and spinal cord (meningitis). Pneumococcal pneumonia is most common among adults. Pneumococcal meningitis can cause deafness and brain damage, and it kills about 1 child in 10 who get it. Anyone can get pneumococcal disease, but children under 3 years of age and adults 59 years and older, people with certain medical conditions, and cigarette smokers are at the highest risk. Before there was a vaccine, the [place] saw:  more than 700 cases of meningitis,  about 13,000 blood infections,  about 5 million ear infections, and  about 200 deaths in children under 5 each year from pneumococcal disease. Since vaccine became available, severe pneumococcal disease in these children has fallen by 88%. About 18,000 older adults die of pneumococcal disease each year in the [place]. Treatment of pneumococcal infections with penicillin and other drugs is not as effective as it used to be, because some strains of the disease have become resistant to these drugs. This makes prevention of the disease, through vaccination, even more important. 2. PCV13 vaccine Pneumococcal conjugate vaccine (called PCV13) protects against 13 types of pneumococcal bacteria. PCV13 is routinely given to children at 2, 4, 6, and 75-71 months of age. It is also recommended for children and adults 35 to 59 years of age with certain health conditions, and for all adults 30 years of age and older. Your doctor can give you details. 3. Some people should not get this vaccine Anyone who has ever had a life-threatening allergic reaction to a dose of this vaccine, to an earlier pneumococcal vaccine called PCV7, or to any vaccine containing diphtheria toxoid (for example, DTaP), should not get PCV13. Anyone with a severe allergy to any component of PCV13 should not get the vaccine. Tell your  doctor if the person being vaccinated has any severe allergies. If the person scheduled for vaccination is not feeling well, your healthcare provider might decide to reschedule the shot on another day. 4. Risks of a vaccine reaction With any medicine, including vaccines, there is a chance of reactions. These are usually mild and go away on their own, but serious reactions are also possible. Problems reported following PCV13 varied by age and dose in the series. The most common problems reported among children were:  About half became drowsy after the shot, had a temporary loss of appetite, or had redness or tenderness where the shot was given.  About 1 out of 3 had swelling where the shot was given.  About 1 out of 3 had a mild fever, and about 1 in 20 had a fever over 102.49F.  Up to about 8  out of 10 became fussy or irritable. Adults have reported pain, redness, and swelling where the shot was given; also mild fever, fatigue, headache, chills, or muscle pain. Young children who get PCV13 along with inactivated flu vaccine at the same time may be at increased risk for seizures caused by fever. Ask your doctor for more information. Problems that could happen after any vaccine:  People sometimes faint after a medical procedure, including vaccination. Sitting or lying down for about 15 minutes can help prevent fainting, and injuries caused by a fall. Tell your doctor if you feel dizzy, or have vision changes or ringing in the ears.  Some older children and adults get severe pain in the shoulder and have difficulty moving the arm where a shot was given. This happens very rarely.  Any medication can cause a severe allergic reaction. Such reactions from a vaccine are very rare, estimated at about 1 in a million doses, and would happen within a few minutes to a few hours after the vaccination. As with any medicine, there is a very small chance of a vaccine causing a serious injury or death. The  safety of vaccines is always being monitored. For more information, visit: http://floyd.org/www.cdc.gov/vaccinesafety/ 5. What if there is a serious reaction? What should I look for?  Look for anything that concerns you, such as signs of a severe allergic reaction, very high fever, or unusual behavior. Signs of a severe allergic reaction can include hives, swelling of the face and throat, difficulty breathing, a fast heartbeat, dizziness, and weakness-usually within a few minutes to a few hours after the vaccination. What should I do?  If you think it is a severe allergic reaction or other emergency that can't wait, call 9-1-1 or get the person to the nearest hospital. Otherwise, call your doctor. Reactions should be reported to the Vaccine Adverse Event Reporting System (VAERS). Your doctor should file this report, or you can do it yourself through the VAERS web site at www.vaers.LAgents.nohhs.gov, or by calling 1-234 059 7261. VAERS does not give medical advice. 6. The National Vaccine Injury Compensation Program The [employer] Vaccine Injury Compensation Program (VICP) is a federal program that was created to compensate people who may have been injured by certain vaccines. Persons who believe they may have been injured by a vaccine can learn about the program and about filing a claim by calling 1-3518345981 or visiting the VICP website at SpiritualWord.atwww.hrsa.gov/vaccinecompensation. There is a time limit to file a claim for compensation. 7. How can I learn more?  Ask your healthcare provider. He or she can give you the vaccine package insert or suggest other sources of information.  Call your local or state health department.  Contact the Centers for Disease Control and Prevention (CDC):  Call (708) 312-07571-773 642 3718 (1-800-CDC-INFO) or  Visit CDC's website at PicCapture.uywww.cdc.gov/vaccines Vaccine Information Statement PCV13 Vaccine (06/19/2014)   This information is not intended to replace advice given to you by your health care provider.  Make sure you discuss any questions you have with your health care provider.   Document Released: 05/29/2006 Document Revised: 08/22/2014 Document Reviewed: 06/26/2014 Elsevier Interactive Patient Education [employer]2016 Elsevier Inc.
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
## 2016-06-14 ENCOUNTER — Telehealth: Payer: Self-pay | Admitting: Internal Medicine

## 2016-06-14 DIAGNOSIS — R0789 Other chest pain: Secondary | ICD-10-CM

## 2016-06-14 NOTE — Telephone Encounter (Signed)
Pt would like someone to cal him regarding lab results. Please advise. Thanks! ep

## 2016-06-15 ENCOUNTER — Encounter: Payer: Self-pay | Admitting: Internal Medicine

## 2016-06-15 DIAGNOSIS — M79622 Pain in left upper arm: Secondary | ICD-10-CM | POA: Insufficient documentation

## 2016-06-15 NOTE — Assessment & Plan Note (Addendum)
-  Differential includes MSK pain, post-herpetic neuralgia, and increased activity with spread of Paget's disease. No swelling or asymmetry of L axillary tissue compared to R. Will obtain electrolytes, as patient does have history of Paget's disease (R hip). Will obtain CBC to reassure patient against cancer. Patient's weight has also been stable and no increased SOB also reassuring.  - If alk phos elevated, consider reinitiating bisphosphonates vs zoledronic acid - Consider CXR if labs normal

## 2016-06-15 NOTE — Telephone Encounter (Signed)
Updated patient evening of 10/31.

## 2016-06-17 NOTE — Telephone Encounter (Signed)
Called patient and recommended chest x-ray due to his history of Paget's disease. Placed order. Told him to [facility].  Dani GobbleHillary Fitzgerald, MD [facility], PGY-2

## 2016-06-22 ENCOUNTER — Ambulatory Visit (HOSPITAL_COMMUNITY)
Admission: RE | Admit: 2016-06-22 | Discharge: 2016-06-22 | Disposition: A | Discharge status: Home / Self Care | Payer: Medicare Other | Source: Ambulatory Visit | Attending: Family Medicine | Admitting: Family Medicine

## 2016-06-22 DIAGNOSIS — I700 Atherosclerosis of aorta: Secondary | ICD-10-CM | POA: Insufficient documentation

## 2016-06-22 DIAGNOSIS — M5134 Other intervertebral disc degeneration, thoracic region: Secondary | ICD-10-CM | POA: Diagnosis not present

## 2016-06-22 DIAGNOSIS — R0789 Other chest pain: Secondary | ICD-10-CM | POA: Diagnosis not present

## 2016-06-23 ENCOUNTER — Telehealth: Payer: Self-pay | Admitting: Internal Medicine

## 2016-06-23 DIAGNOSIS — R0789 Other chest pain: Secondary | ICD-10-CM

## 2016-06-23 MED ORDER — CAPSAICIN 0.025 % EX CREA: TOPICAL_CREAM | Freq: Two times a day (BID) | CUTANEOUS | 3 refills | Status: DC

## 2016-06-23 NOTE — Telephone Encounter (Signed)
Called patient to inform him of normal chest x-ray. He may have postherpetic neuralgia, as he reports having had shingles in the area. Will have him try capsaicin cream. If not relieve, will advance to gabapentin.

## 2016-09-09 ENCOUNTER — Telehealth: Payer: Self-pay | Admitting: Internal Medicine

## 2016-09-09 NOTE — Telephone Encounter (Signed)
Pt says he has the flu. He called the ambulance but didn't want to go to the hospital.  He would like to know what medicine he could get for chest congestion. Please advise

## 2016-09-09 NOTE — Telephone Encounter (Signed)
[name],  I recommend being seen at urgent care to assess your symptoms further. Cough medicine with ingredients of dextromethorphan and guaifenesin can help with chest congestion. Please ask your pharmacist to help you pick out these over the counter options.  Best, Dr. Sampson GoonFitzgerald

## 2016-09-12 NOTE — Telephone Encounter (Signed)
Patient informed, expressed understanding. 

## 2016-11-18 IMAGING — DX DG CHEST 2V
2 series · 2 of 2 positions shown · non-contrast
Comparison: 12/22/2008

CLINICAL DATA: Left chest wall and upper abdominal pain for 1 year.
History of Paget's disease.

EXAM:
CHEST  2 VIEW

[chest pa]
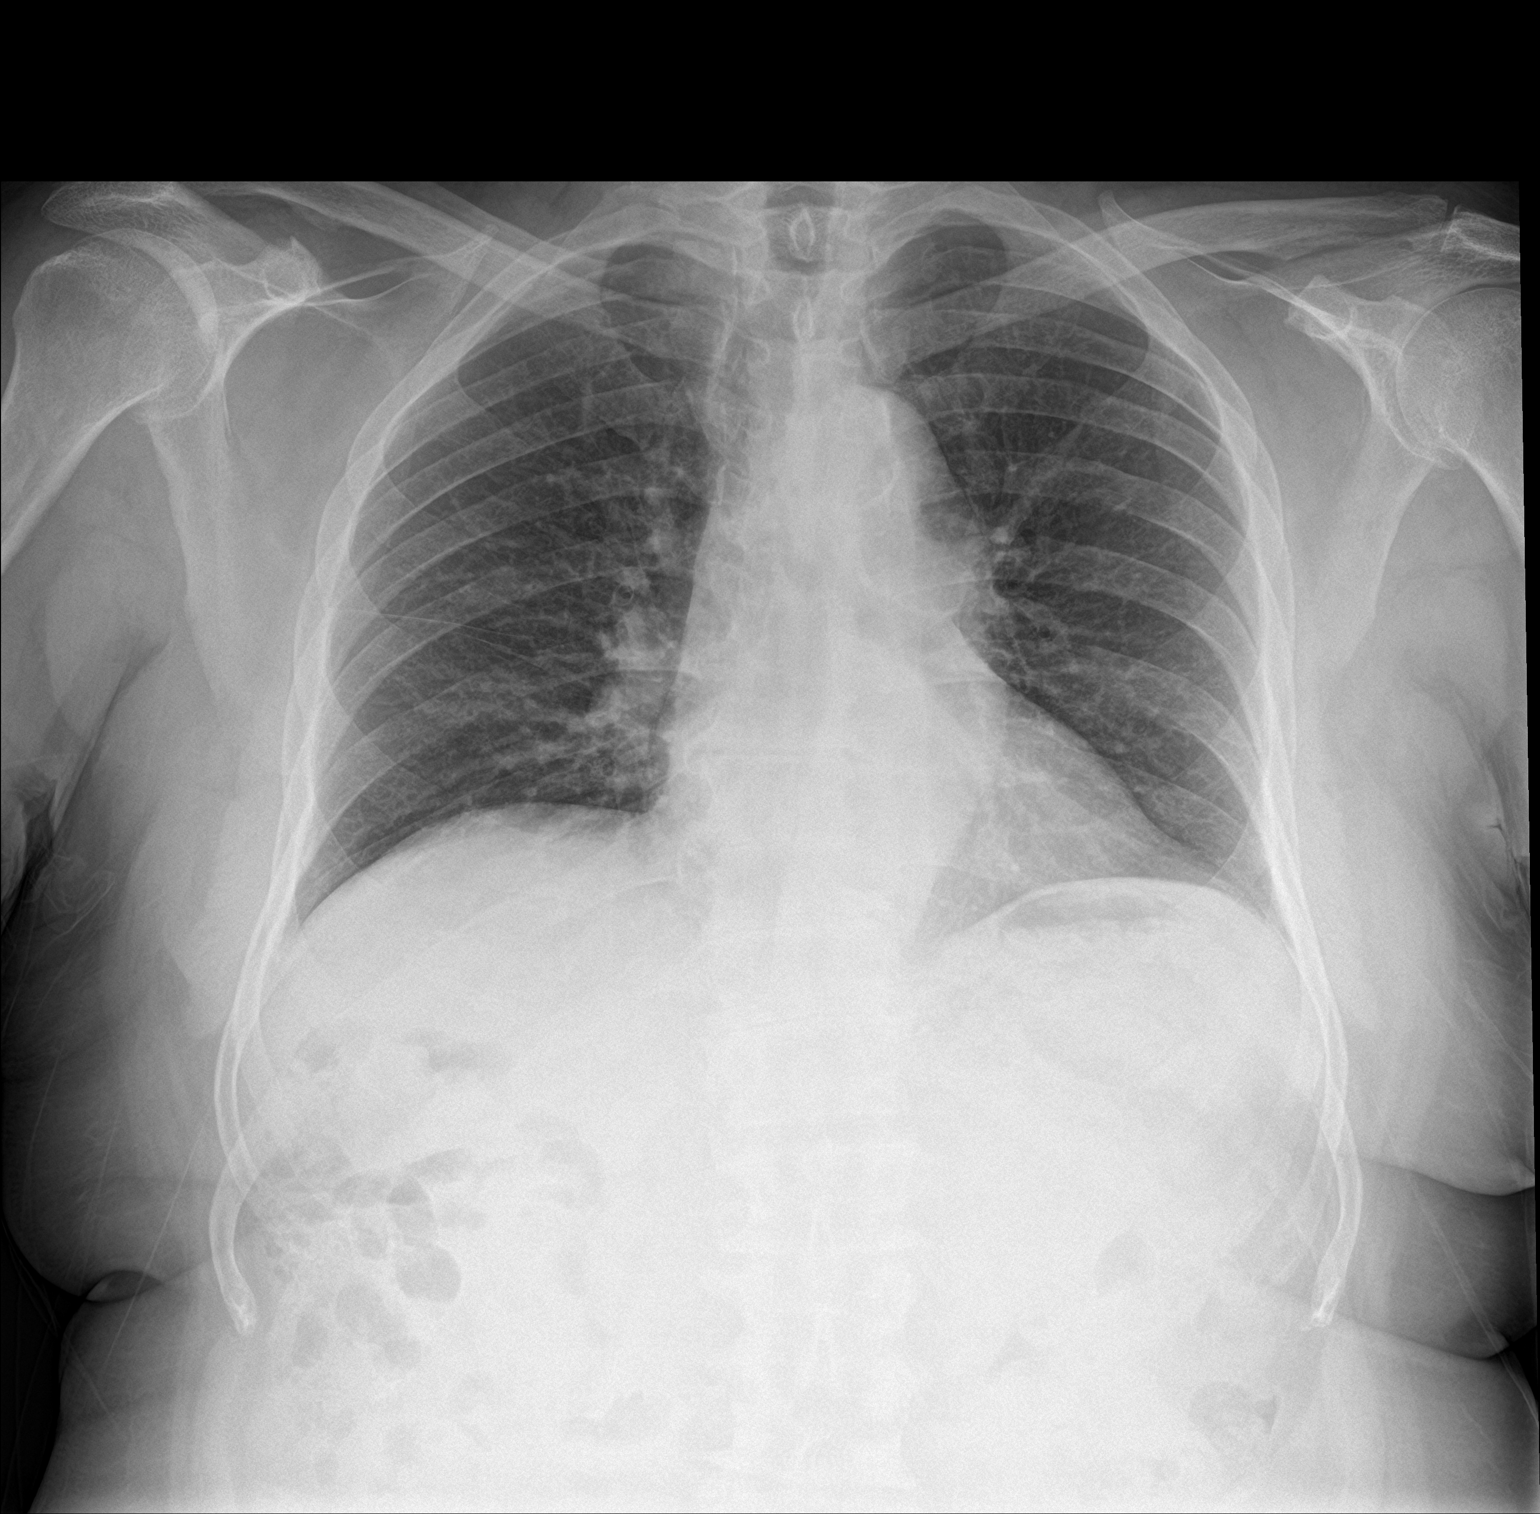

[chest lat]
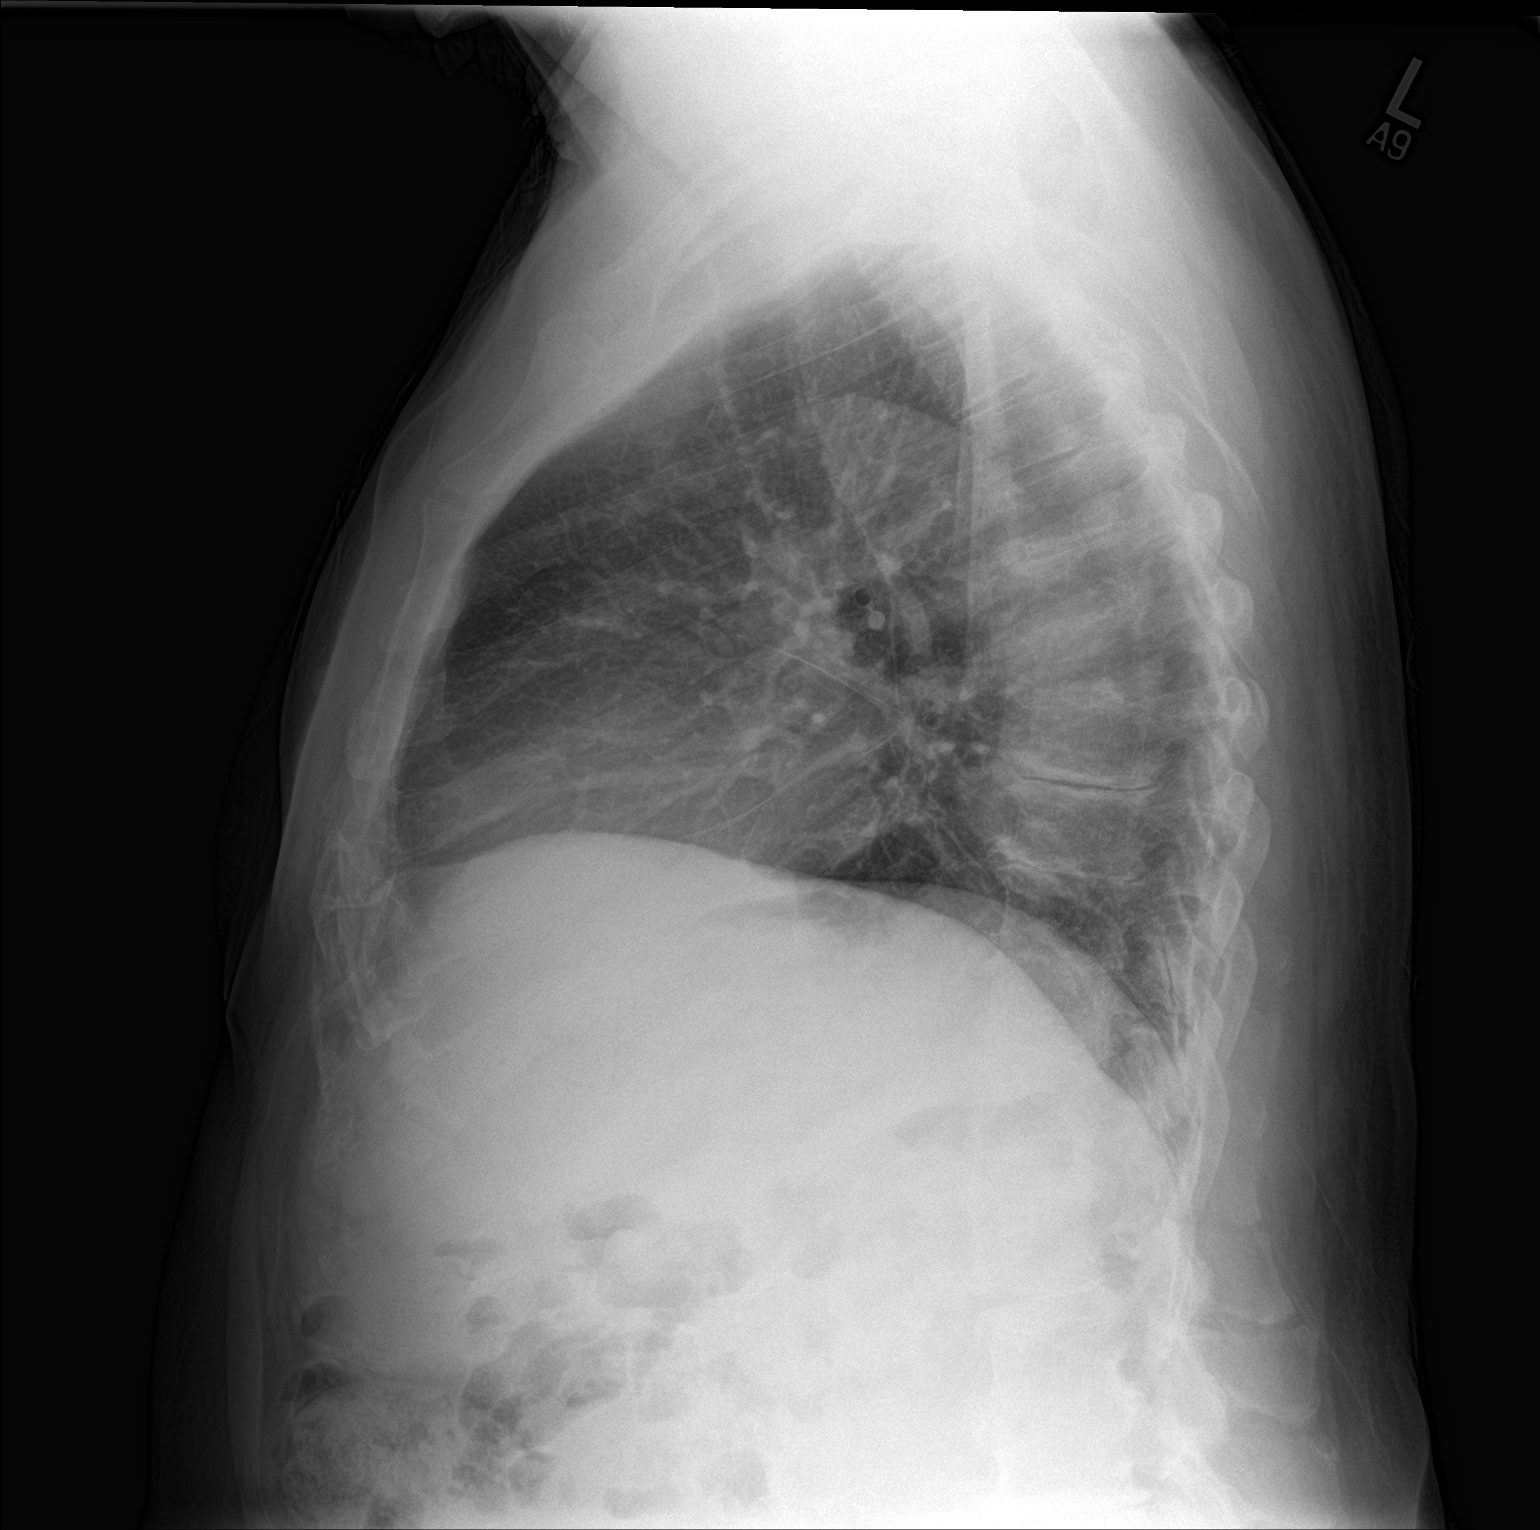

[2 of 2 positions shown; findings below may reference images not displayed]

FINDINGS: The lower trachea is mildly deviated towards the right and this
appears unchanged. Atherosclerotic calcifications at the aortic
arch. Both lungs are clear. Heart size is within normal limits.
Degenerative disc and endplate changes in lower thoracic spine. No
pleural effusions.
IMPRESSION: No active cardiopulmonary disease.

Aortic atherosclerosis.

## 2016-12-28 ENCOUNTER — Other Ambulatory Visit: Payer: Self-pay | Admitting: Internal Medicine

## 2016-12-28 DIAGNOSIS — I10 Essential (primary) hypertension: Secondary | ICD-10-CM

## 2016-12-28 MED ORDER — AMLODIPINE BESYLATE 5 MG PO TABS: 5.0000 mg | ORAL_TABLET | Freq: Every day | ORAL | 0 refills | Status: DC

## 2016-12-28 NOTE — Telephone Encounter (Signed)
Sent refill for amlodipine to his pharmacy.

## 2016-12-28 NOTE — Telephone Encounter (Signed)
Pt would like a refill on his BP medication called in. jw

## 2017-05-15 ENCOUNTER — Other Ambulatory Visit: Payer: Self-pay | Admitting: Student

## 2017-05-15 DIAGNOSIS — I10 Essential (primary) hypertension: Secondary | ICD-10-CM

## 2017-07-05 ENCOUNTER — Other Ambulatory Visit: Payer: Self-pay

## 2017-07-05 ENCOUNTER — Encounter: Payer: Self-pay | Admitting: Licensed Clinical Social Worker

## 2017-07-05 ENCOUNTER — Ambulatory Visit (INDEPENDENT_AMBULATORY_CARE_PROVIDER_SITE_OTHER): Payer: Medicare Other | Admitting: *Deleted

## 2017-07-05 ENCOUNTER — Encounter: Payer: Self-pay | Admitting: *Deleted

## 2017-07-05 ENCOUNTER — Ambulatory Visit: Payer: Medicare Other | Admitting: Internal Medicine

## 2017-07-05 VITALS — BP 166/76 | HR 55 | Temp 97.4°F | Ht 65.0 in | Wt 173.6 lb

## 2017-07-05 DIAGNOSIS — Z0000 Encounter for general adult medical examination without abnormal findings: Secondary | ICD-10-CM | POA: Diagnosis not present

## 2017-07-05 NOTE — Patient Instructions (Addendum)
Paul [name],  Thank you for taking time to come for yourMedicare Wellness Visit. I appreciate your ongoing commitment to your health goals. Please review the following plan we discussed and let me know if I can assist you in the future.   These are the goals we discussed:  Goals    . Be able to climb stairs without difficulty breathing (pt-stated)    . Blood Pressure < 150/90       Fall Prevention in the Home Falls can cause injuries. They can happen to people of all ages. There are many things you can do to make your home safe and to help prevent falls. What can I do on the outside of my home?  Regularly fix the edges of walkways and driveways and fix any cracks.  Remove anything that might make you trip as you walk through a door, such as a raised step or threshold.  Trim any bushes or trees on the path to your home.  Use bright outdoor lighting.  Clear any walking paths of anything that might make someone trip, such as rocks or tools.  Regularly check to see if handrails are loose or broken. Make sure that both sides of any steps have handrails.  Any raised decks and porches should have guardrails on the edges.  Have any leaves, snow, or ice cleared regularly.  Use sand or salt on walking paths during winter.  Clean up any spills in your garage right away. This includes oil or grease spills. What can I do in the bathroom?  Use night lights.  Install grab bars by the toilet and in the tub and shower. Do not use towel bars as grab bars.  Use non-skid mats or decals in the tub or shower.  If you need to sit down in the shower, use a plastic, non-slip stool.  Keep the floor dry. Clean up any water that spills on the floor as soon as it happens.  Remove soap buildup in the tub or shower regularly.  Attach bath mats securely with double-sided non-slip rug tape.  Do not have throw rugs and other things on the floor that can make you trip. What can I do in the  bedroom?  Use night lights.  Make sure that you have a light by your bed that is easy to reach.  Do not use any sheets or blankets that are too big for your bed. They should not hang down onto the floor.  Have a firm chair that has side arms. You can use this for support while you get dressed.  Do not have throw rugs and other things on the floor that can make you trip. What can I do in the kitchen?  Clean up any spills right away.  Avoid walking on wet floors.  Keep items that you use a lot in easy-to-reach places.  If you need to reach something above you, use a strong step stool that has a grab bar.  Keep electrical cords out of the way.  Do not use floor polish or wax that makes floors slippery. If you must use wax, use non-skid floor wax.  Do not have throw rugs and other things on the floor that can make you trip. What can I do with my stairs?  Do not leave any items on the stairs.  Make sure that there are handrails on both sides of the stairs and use them. Fix handrails that are broken or loose. Make sure that handrails are  as long as the stairways.  Check any carpeting to make sure that it is firmly attached to the stairs. Fix any carpet that is loose or worn.  Avoid having throw rugs at the top or bottom of the stairs. If you do have throw rugs, attach them to the floor with carpet tape.  Make sure that you have a light switch at the top of the stairs and the bottom of the stairs. If you do not have them, ask someone to add them for you. What else can I do to help prevent falls?  Wear shoes that: ? Do not have high heels. ? Have rubber bottoms. ? Are comfortable and fit you well. ? Are closed at the toe. Do not wear sandals.  If you use a stepladder: ? Make sure that it is fully opened. Do not climb a closed stepladder. ? Make sure that both sides of the stepladder are locked into place. ? Ask someone to hold it for you, if possible.  Clearly mark and make  sure that you can see: ? Any grab bars or handrails. ? First and last steps. ? Where the edge of each step is.  Use tools that help you move around (mobility aids) if they are needed. These include: ? Canes. ? Walkers. ? Scooters. ? Crutches.  Turn on the lights when you go into a dark area. Replace any light bulbs as soon as they burn out.  Set up your furniture so you have a clear path. Avoid moving your furniture around.  If any of your floors are uneven, fix them.  If there are any pets around you, be aware of where they are.  Review your medicines with your doctor. Some medicines can make you feel dizzy. This can increase your chance of falling. Ask your doctor what other things that you can do to help prevent falls. This information is not intended to replace advice given to you by your health care provider. Make sure you discuss any questions you have with your health care provider. Document Released: 05/28/2009 Document Revised: 01/07/2016 Document Reviewed: 09/05/2014 Elsevier Interactive Patient Education  2018 [employer]   Health Maintenance, Male A healthy lifestyle and preventive care is important for your health and wellness. Ask your health care provider about what schedule of regular examinations is right for you. What should I know about weight and diet? Eat a Healthy Diet  Eat plenty of vegetables, fruits, whole grains, low-fat dairy products, and lean protein.  Do not eat a lot of foods high in solid fats, added sugars, or salt.  Maintain a Healthy Weight Regular exercise can help you achieve or maintain a healthy weight. You should:  Do at least 150 minutes of exercise each week. The exercise should increase your heart rate and make you sweat (moderate-intensity exercise).  Do strength-training exercises at least twice a week.  Watch Your Levels of Cholesterol and Blood Lipids  Have your blood tested for lipids and cholesterol every 5 years starting  at 81 years of age. If you are at high risk for heart disease, you should start having your blood tested when you are 81 years old. You may need to have your cholesterol levels checked more often if: ? Your lipid or cholesterol levels are high. ? You are older than 81 years of age. ? You are at high risk for heart disease.  What should I know about cancer screening? Many types of cancers can be detected early and may  often be prevented. Lung Cancer  You should be screened every year for lung cancer if: ? You are a current smoker who has smoked for at least 30 years. ? You are a former smoker who has quit within the past 15 years.  Talk to your health care provider about your screening options, when you should start screening, and how often you should be screened.  Colorectal Cancer  Routine colorectal cancer screening usually begins at 81 years of age and should be repeated every 5-10 years until you are 81 years old. You may need to be screened more often if early forms of precancerous polyps or small growths are found. Your health care provider may recommend screening at an earlier age if you have risk factors for colon cancer.  Your health care provider may recommend using home test kits to check for hidden blood in the stool.  A small camera at the end of a tube can be used to examine your colon (sigmoidoscopy or colonoscopy). This checks for the earliest forms of colorectal cancer.  Prostate and Testicular Cancer  Depending on your age and overall health, your health care provider may do certain tests to screen for prostate and testicular cancer.  Talk to your health care provider about any symptoms or concerns you have about testicular or prostate cancer.  Skin Cancer  Check your skin from head to toe regularly.  Tell your health care provider about any new moles or changes in moles, especially if: ? There is a change in a mole's size, shape, or color. ? You have a mole that  is larger than a pencil eraser.  Always use sunscreen. Apply sunscreen liberally and repeat throughout the day.  Protect yourself by wearing long sleeves, pants, a wide-brimmed hat, and sunglasses when outside.  What should I know about heart disease, diabetes, and high blood pressure?  If you are 3-26 years of age, have your blood pressure checked every 3-5 years. If you are 74 years of age or older, have your blood pressure checked every year. You should have your blood pressure measured twice-once when you are at a hospital or clinic, and once when you are not at a hospital or clinic. Record the average of the two measurements. To check your blood pressure when you are not at a hospital or clinic, you can use: ? An automated blood pressure machine at a pharmacy. ? A home blood pressure monitor.  Talk to your health care provider about your target blood pressure.  If you are between 56-45 years old, ask your health care provider if you should take aspirin to prevent heart disease.  Have regular diabetes screenings by checking your fasting blood sugar level. ? If you are at a normal weight and have a low risk for diabetes, have this test once every three years after the age of 74. ? If you are overweight and have a high risk for diabetes, consider being tested at a younger age or more often.  A one-time screening for abdominal aortic aneurysm (AAA) by ultrasound is recommended for men aged 65-75 years who are current or former smokers. What should I know about preventing infection? Hepatitis B If you have a higher risk for hepatitis B, you should be screened for this virus. Talk with your health care provider to find out if you are at risk for hepatitis B infection. Hepatitis C Blood testing is recommended for:  Everyone born from [birth year] through 1963.  Anyone with known risk  factors for hepatitis C.  Sexually Transmitted Diseases (STDs)  You should be screened each year for STDs  including gonorrhea and chlamydia if: ? You are sexually active and are younger than 81 years of age. ? You are older than 81 years of age and your health care provider tells you that you are at risk for this type of infection. ? Your sexual activity has changed since you were last screened and you are at an increased risk for chlamydia or gonorrhea. Ask your health care provider if you are at risk.  Talk with your health care provider about whether you are at high risk of being infected with HIV. Your health care provider may recommend a prescription medicine to help prevent HIV infection.  What else can I do?  Schedule regular health, dental, and eye exams.  Stay current with your vaccines (immunizations).  Do not use any tobacco products, such as cigarettes, chewing tobacco, and e-cigarettes. If you need help quitting, ask your health care provider.  Limit alcohol intake to no more than 2 drinks per day. One drink equals 12 ounces of beer, 5 ounces of wine, or 1 ounces of hard liquor.  Do not use street drugs.  Do not share needles.  Ask your health care provider for help if you need support or information about quitting drugs.  Tell your health care provider if you often feel depressed.  Tell your health care provider if you have ever been abused or do not feel safe at home. This information is not intended to replace advice given to you by your health care provider. Make sure you discuss any questions you have with your health care provider. Document Released: 01/28/2008 Document Revised: 03/30/2016 Document Reviewed: 05/05/2015 Elsevier Interactive Patient Education  [employer]2018 Elsevier Inc.

## 2017-07-05 NOTE — Progress Notes (Signed)
  Type of Service: Integrated Behavioral Health warm handoff  Estimate time:20 minutes : Interpreter:No.  SUBJECTIVE: Paul [name] referred by Paul ShamesLauren, RN for: symptoms of  depression and stressors related to: family issues. Patient reports the following concerns:  Family stressors due to step son living with them and creating tension in the home, denies any abuse. States he spend most of his time in the room watching TV. Patient states he would like to get out of the house more and find something to do. Patient reports he is unable to read due to dyslexia but did well in life as a Psychologist, occupationalWelder.  LIFE CONTEXT:  Family & Social:patient lives with his wife and 81 year old stepson. ,has other children that live out of state. Limited support system.   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 Patient enjoys riding his bike.  Life changes: Paul [name] recently moved in with patient and wife.  GOALS: Patient will reduce symptoms of: depression and stress, and increase  ability WU:[id number]:coping skills, self-management skills and stress reduction. INTERVENTION: , Occupational hygienistCommunity Resource and Supportive Counseling ; Behavioral Activation   PHQ 9=13, indication of: moderate depression. Patient positive for SI in number 9 indicated he would never take his life just think at times he is useless due to his age. No active SI, no plan and no thoughts or intentions of acting on a plan. No hx of SI. ISSUES DISCUSSED: Integrated care services, support system, previous and current coping skills, options with family stressors, community resources , things patient enjoy or use to enjoy doing, Seniors Resources  and transportation barriers.    ASSESSMENT:Patient currently experiencing symptoms of depression.  Symptoms exacerbated by family stressors and lack of social activity. Patient may benefit from, and is in agreement to contact Senior Resources to get connected to their  program.  Phone number and contact information provided.   PLAN: Patient will call [facility] Resources when he gets home.  Warm Hand Off Completed.     Paul Hineseborah Caedyn Tassinari, LCSW Licensed Clinical Social Worker [facility]   [phone] 12:02 PM
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
## 2017-07-10 ENCOUNTER — Encounter: Payer: Self-pay | Admitting: *Deleted

## 2017-07-10 NOTE — Progress Notes (Signed)
I have reviewed this visit and discussed with Alecia LemmingLauren Ducatte, RN, BSN, and agree with her documentation.  Dani GobbleHillary Fitzgerald, MD [facility], PGY-3

## 2017-07-19 ENCOUNTER — Encounter: Payer: Self-pay | Admitting: Internal Medicine

## 2017-07-19 ENCOUNTER — Other Ambulatory Visit: Payer: Self-pay

## 2017-07-19 ENCOUNTER — Ambulatory Visit (INDEPENDENT_AMBULATORY_CARE_PROVIDER_SITE_OTHER): Payer: Medicare Other | Admitting: Internal Medicine

## 2017-07-19 VITALS — BP 140/60 | HR 58 | Temp 97.8°F | Ht 65.0 in | Wt 172.2 lb

## 2017-07-19 DIAGNOSIS — R0602 Shortness of breath: Secondary | ICD-10-CM | POA: Diagnosis not present
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
## 2017-07-19 NOTE — Patient Instructions (Signed)
[name],  Thank you for coming in. I do not believe your shortness of breath will be improved by supplemental oxygen, as your oxygen levels stayed steady with walking on the monitor. Your lungs sound clear and you have no edema on the legs, so I do not think you are having a heart failure flare. Signs of heart failure flare are shortness of breath with laying flat and increase in weight suddenly. I would want to know about this and if you had any chest pain.  Smoke exposure can worsen shortness of breath, as these chemicals are irritating to the lungs.  I recommend trying to increase your stamina by walking daily.  Please try melatonin 5 mg about 30 minutes before bedtime to help with sleep. Also try avoiding TV and instead do a relaxing activity like reading or taking a bath/shower before bed.  Best, Dr. Sampson GoonFitzgerald

## 2017-07-21 NOTE — Progress Notes (Signed)
[facility] Progress Note  Subjective:  Paul [name] is a 83 y.o. male with history of HTN and HFpEF (06/05/2013 with EF of 60% and mild LVH) who presents with concern about his breathing. Patient had seen a television commercial about supplemental oxygen and wants to know if that would help him. What he has noticed recently is that he will feel short of breath after walking up stairs. He is able to ride his bike, however, without feeling short of breath. He denies shortness of breath with laying flat and usually sleeps on his side without extra pillows. He had taken lasix 20 mg in the past but says he has been off this for "a couple years" and is only taking amlodipine for HTN. He reports second hand smoke exposure with his wife smoking inside and stepson smoking heroine in the home. He does not shortness of breath improves when he goes outside. He does not feel that he can ask his family to change their smoking habits. ROS: No chest pain, no palpitations   No Known Allergies  Social History   Tobacco Use  . Smoking status: Never Smoker  . Smokeless tobacco: Never Used  Substance Use Topics  . Alcohol use: No    Objective: Blood pressure 140/60, pulse (!) 58, temperature 97.8 F (36.6 C), temperature source Oral, height 5\' 5"  (1.651 m), weight 172 lb 3.2 oz (78.1 kg), SpO2 98 %. Body mass index is 28.66 kg/m. Ambulated with pulse oximeter and SpO2 remained above 98%.  Constitutional: Overweight male in NAD HENT: MMM, normal posterior oropharynx and no nasal congestion Cardiovascular: RRR, S1, S2, no m/r/g.  Pulmonary/Chest: Effort normal and breath sounds normal.  Musculoskeletal: No LE edema Neurological: AOx3, no focal deficits. Skin: Skin is warm and dry. No rash noted.  Psychiatric: Normal mood and affect.  Vitals reviewed  Assessment/Plan: Shortness of breath - Patient able to maintain his oxygen saturation at rest and with ambulation. He relates his symptoms  to significant smoke exposure at home, and I suspect this is the cause of his symptoms. Denies wheezing. Denies orthopnea or chest pain, suggesting against cardiac etiology. Lung exam normal without focal findings. - Explained that he does not need or qualify for supplemental O2.  - Counseled patient to discuss hazard of smoke exposure with his family, but he is hesitant. Suggested that I can discuss importance of cessation with his wife, as she is overdue for wellness exam. LCSW also provided community resources for support in case patient ever feels threatened by family members insofar as discussing impact of smoke on his health. - If sensation of dyspnea worsens, would recommend PFTs to determine if he might benefit from an inhaler. - Recommended gradually increasing physical activity, such as daily walking. Counseled if he had worsening dyspnea with this or chest pain, would want to see back and may need Cardiology referral.   Follow-up if symptoms worsen.  Paul GobbleHillary Emil Weigold, MD [facility], PGY-3

## 2017-07-23 ENCOUNTER — Encounter: Payer: Self-pay | Admitting: Internal Medicine

## 2017-07-23 NOTE — Assessment & Plan Note (Addendum)
-   Patient able to maintain his oxygen saturation at rest and with ambulation. He relates his symptoms to significant smoke exposure at home, and I suspect this is the cause of his symptoms. Denies wheezing. Denies orthopnea or chest pain, suggesting against cardiac etiology. Lung exam normal without focal findings. - Explained that he does not need or qualify for supplemental O2.  - Counseled patient to discuss hazard of smoke exposure with his family, but he is hesitant. Suggested that I can discuss importance of cessation with his wife, as she is overdue for wellness exam. LCSW also provided community resources for support in case patient ever feels threatened by family members insofar as discussing impact of smoke on his health. - If sensation of dyspnea worsens, would recommend PFTs to determine if he might benefit from an inhaler. - Recommended gradually increasing physical activity, such as daily walking. Counseled if he had worsening dyspnea with this or chest pain, would want to see back and may need Cardiology referral.

## 2017-08-23 ENCOUNTER — Other Ambulatory Visit: Payer: Self-pay | Admitting: Internal Medicine

## 2017-08-23 DIAGNOSIS — I10 Essential (primary) hypertension: Secondary | ICD-10-CM

## 2017-10-19 ENCOUNTER — Ambulatory Visit (INDEPENDENT_AMBULATORY_CARE_PROVIDER_SITE_OTHER): Payer: Medicare Other | Admitting: Internal Medicine

## 2017-10-19 ENCOUNTER — Encounter: Payer: Self-pay | Admitting: Internal Medicine

## 2017-10-19 ENCOUNTER — Other Ambulatory Visit: Payer: Self-pay

## 2017-10-19 ENCOUNTER — Encounter: Payer: Self-pay | Admitting: Licensed Clinical Social Worker

## 2017-10-19 VITALS — BP 118/74 | HR 58 | Temp 97.9°F | Ht 65.0 in | Wt 172.4 lb

## 2017-10-19 DIAGNOSIS — K5909 Other constipation: Secondary | ICD-10-CM | POA: Diagnosis not present

## 2017-10-19 MED ORDER — POLYETHYLENE GLYCOL 3350 17 GM/SCOOP PO POWD
ORAL | 0 refills | Status: DC
Start: 2017-10-19 — End: 2021-06-03

## 2017-10-19 NOTE — Progress Notes (Signed)
Type of Service: Clinical Social Work Consult  Claud KelpRobert Demond is a 84 y.o. male referred for assistance with transportation home after appointment with Provider today. [facility].  When they dropped him off they did not provide the return number.  LCSW attempted to call several [facility] numbers but was unsuccessful. No other transportation options available at this time.  Taxi voucher provided to transport patient home. Patient appreciative of the assistance.   Sammuel Hineseborah Moore, LCSW Licensed Clinical Social Worker [facility]   [phone] 3:22 PM

## 2017-10-19 NOTE — Patient Instructions (Signed)
Mr. Birdie RiddleJett,  Please try miralax daily. Start with half to one capful daily. The goal is to have one soft bowel movement daily. Increasing water intake to 6-8 cups daily would also be helpful. Call me if the medication isn't helping in a couple weeks.  Best, Dr. Sampson GoonFitzgerald

## 2017-10-22 ENCOUNTER — Encounter: Payer: Self-pay | Admitting: Internal Medicine

## 2017-10-22 NOTE — Assessment & Plan Note (Addendum)
-   Chronic. No red flags like weight loss, loss of appetite. Suspect 2/2 poor fluid intake.  - Recommended increasing water intake to 6-8 cups a day. - Recommended trying miralax--titrating up from 0.5 capful a day as needed to have 1 soft BM daily. - If no improvement with miralax, would recheck CMP to see what calcium levels are with history of Paget's

## 2017-10-22 NOTE — Progress Notes (Signed)
[name] Progress Note  Subjective:  Paul [name] is an 84 y.o. male with history of HTN and Paget's disease who presents for constipation. He says he has BMs about every other day and that they are sometimes hard. He had a hard BM a couple weeks ago with a small amount of blood present on stool. He denies dark stools or BRBPR since then. He takes magnesium citrate when he wants to have a bowel movement but does not like taking because of diarrhea. He has refused colonoscopy because of not wanting procedure and still declines today; denies family history of colon cancer. He says he does not drink much water and has soda. He says he has lower abdominal pain sometimes with trying to have BM. ROS: No fevers, no reflux   No Known Allergies  Social History   Tobacco Use  . Smoking status: Never Smoker  . Smokeless tobacco: Never Used  Substance Use Topics  . Alcohol use: No    Objective: Blood pressure 118/74, pulse (!) 58, temperature 97.9 F (36.6 C), temperature source Oral, height 5\' 5"  (1.651 m), weight 172 lb 6.4 oz (78.2 kg), SpO2 96 %. Body mass index is 28.69 kg/m. Constitutional: Pleasant older male in NAD HENT: MM slightly tacky Cardiovascular: RRR, S1, S2, no m/r/g.  Pulmonary/Chest: Effort normal and breath sounds normal.  Abdominal: Soft. +BS, NT Musculoskeletal: No LE edema Neurological: AOx3, no focal deficits. Skin: Skin is warm and dry. No rash noted.  Psychiatric: Normal mood and affect.  Vitals reviewed  Assessment/Plan: CONSTIPATION, CHRONIC - Chronic. No red flags like weight loss, loss of appetite. Suspect 2/2 poor fluid intake.  - Recommended increasing water intake to 6-8 cups a day. - Recommended trying miralax--titrating up from 0.5 capful a day as needed to have 1 soft BM daily. - If no improvement with miralax, would recheck CMP to see what calcium levels are with history of Paget's  Follow-up prn.  Dani GobbleHillary Ace Bergfeld, MD [name], PGY-3

## 2017-11-27 ENCOUNTER — Other Ambulatory Visit: Payer: Self-pay | Admitting: Internal Medicine

## 2017-11-27 DIAGNOSIS — I10 Essential (primary) hypertension: Secondary | ICD-10-CM

## 2018-02-24 ENCOUNTER — Encounter (HOSPITAL_COMMUNITY): Payer: Self-pay | Admitting: Emergency Medicine

## 2018-02-24 ENCOUNTER — Emergency Department (HOSPITAL_COMMUNITY)
Admission: EM | Admit: 2018-02-24 | Discharge: 2018-02-24 | Disposition: A | Discharge status: Home / Self Care | Payer: Medicare Other | Attending: Emergency Medicine | Admitting: Emergency Medicine

## 2018-02-24 DIAGNOSIS — R42 Dizziness and giddiness: Secondary | ICD-10-CM | POA: Diagnosis not present

## 2018-02-24 DIAGNOSIS — R531 Weakness: Secondary | ICD-10-CM | POA: Diagnosis not present

## 2018-02-24 DIAGNOSIS — Z79899 Other long term (current) drug therapy: Secondary | ICD-10-CM | POA: Diagnosis not present

## 2018-02-24 DIAGNOSIS — I110 Hypertensive heart disease with heart failure: Secondary | ICD-10-CM | POA: Insufficient documentation

## 2018-02-24 DIAGNOSIS — I5031 Acute diastolic (congestive) heart failure: Secondary | ICD-10-CM | POA: Diagnosis not present

## 2018-02-24 DIAGNOSIS — I10 Essential (primary) hypertension: Secondary | ICD-10-CM | POA: Diagnosis not present

## 2018-02-24 DIAGNOSIS — R6883 Chills (without fever): Secondary | ICD-10-CM | POA: Diagnosis not present

## 2018-02-24 LAB — CBC WITH DIFFERENTIAL/PLATELET
Abs Immature Granulocytes: 0 10*3/uL (ref 0.0–0.1)
BASOS ABS: 0.1 10*3/uL (ref 0.0–0.1)
Basophils Relative: 1 %
EOS PCT: 5 %
Eosinophils Absolute: 0.2 10*3/uL (ref 0.0–0.7)
HCT: 44.6 % (ref 39.0–52.0)
HEMOGLOBIN: 14.3 g/dL (ref 13.0–17.0)
Immature Granulocytes: 1 %
LYMPHS PCT: 34 %
Lymphs Abs: 1.5 10*3/uL (ref 0.7–4.0)
MCH: 31.6 pg (ref 26.0–34.0)
MCHC: 32.1 g/dL (ref 30.0–36.0)
MCV: 98.7 fL (ref 78.0–100.0)
MONO ABS: 0.5 10*3/uL (ref 0.1–1.0)
Monocytes Relative: 12 %
Neutro Abs: 2 10*3/uL (ref 1.7–7.7)
Neutrophils Relative %: 47 %
Platelets: 201 10*3/uL (ref 150–400)
RBC: 4.52 MIL/uL (ref 4.22–5.81)
RDW: 12.1 % (ref 11.5–15.5)
WBC: 4.3 10*3/uL (ref 4.0–10.5)

## 2018-02-24 LAB — BASIC METABOLIC PANEL
Anion gap: 8 (ref 5–15)
BUN: 7 mg/dL — AB (ref 8–23)
CALCIUM: 8.8 mg/dL — AB (ref 8.9–10.3)
CO2: 26 mmol/L (ref 22–32)
Chloride: 104 mmol/L (ref 98–111)
Creatinine, Ser: 1.09 mg/dL (ref 0.61–1.24)
GFR calc Af Amer: >60 mL/min (ref >60)
GFR, EST NON AFRICAN AMERICAN: 59 mL/min — AB (ref >60)
GLUCOSE: 117 mg/dL — AB (ref 70–99)
Potassium: 4 mmol/L (ref 3.5–5.1)
SODIUM: 138 mmol/L (ref 135–145)

## 2018-02-24 NOTE — ED Provider Notes (Signed)
[facility] EMERGENCY DEPARTMENT Provider Note   CSN: [id number] Arrival date & time: 02/24/18  0113     History   Chief Complaint Chief Complaint  Patient presents with  . Dizziness    HPI Paul [name] is a 84 y.o. male.  Patient is an 84 year old male with past medical history of hypertension, hypercholesterolemia.  He presents today for evaluation of weakness and chills.  He states this evening he had the acute onset of shaking and became dizzy when standing.  He describes the dizziness as a lightheadedness, not a spinning type sensation.  He denies any headache, fevers, chest pain, or palpitations.  He does state that he had similar symptoms once before and was told that he had a low heart rate.  The history is provided by the patient.  Dizziness  Quality:  Lightheadedness Severity:  Moderate Onset quality:  Sudden Duration:  2 hours Timing:  Constant Progression:  Unchanged Chronicity:  New Relieved by:  Nothing Worsened by:  Standing up and sitting upright Associated symptoms: no blood in stool, no headaches, no palpitations and no shortness of breath     Past Medical History:  Diagnosis Date  . Hypercholesteremia   . Hypertension     Patient Active Problem List   Diagnosis Date Noted  . Acute diastolic heart failure (HCC) 07/09/2013  . Shortness of breath 05/28/2013  . Erectile dysfunction 02/14/2012  . PAGET'S DISEASE 08/17/2010  . ANKLE PAIN, LEFT 02/16/2010  . BENIGN PROSTATIC HYPERTROPHY, WITH URINARY OBSTRUCTION 03/09/2009  . URINARY INCONTINENCE 03/09/2009  . HYPERLIPIDEMIA 11/28/2008  . INSOMNIA 11/28/2008  . Essential hypertension, benign 11/13/2008  . GASTROESOPHAGEAL REFLUX DISEASE, MILD 11/13/2008  . CONSTIPATION, CHRONIC 11/13/2008    History reviewed. No pertinent surgical history.      Home Medications    Prior to Admission medications   Medication Sig Start Date End Date Taking? Authorizing Provider  amLODipine  (NORVASC) 5 MG tablet TAKE 1 TABLET BY MOUTH EVERY DAY 11/27/17   [name], MD  polyethylene glycol powder [facility]) powder Take 1 teaspoon daily to have one soft bowel movement daily. You can increase amount daily as needed. 10/19/17   [name], MD    Family History Family History  Problem Relation Age of Onset  . Diabetes Mother   . Cancer Father   . Dementia Brother   . Cancer Sister        Lung    Social History Social History   Tobacco Use  . Smoking status: Never Smoker  . Smokeless tobacco: Never Used  Substance Use Topics  . Alcohol use: No  . Drug use: No     Allergies   Patient has no known allergies.   Review of Systems Review of Systems  Respiratory: Negative for shortness of breath.   Cardiovascular: Negative for palpitations.  Gastrointestinal: Negative for blood in stool.  Neurological: Positive for dizziness. Negative for headaches.  All other systems reviewed and are negative.    Physical Exam Updated Vital Signs BP (!) 170/74 (BP Location: Right Arm)   Pulse (!) 53   Temp (!) 97.5 F (36.4 C) (Oral)   Resp 18   Ht 5\' 5"  (1.651 m)   Wt 78 kg (172 lb)   SpO2 99%   BMI 28.62 kg/m   Physical Exam  Constitutional: He is oriented to person, place, and time. He appears well-developed and well-nourished. No distress.  HENT:  Head: Normocephalic and atraumatic.  Mouth/Throat: Oropharynx is  clear and moist.  Eyes: Pupils are equal, round, and reactive to light. EOM are normal.  Neck: Normal range of motion. Neck supple.  Cardiovascular: Normal rate and regular rhythm. Exam reveals no friction rub.  No murmur heard. Pulmonary/Chest: Effort normal and breath sounds normal. No respiratory distress. He has no wheezes. He has no rales.  Abdominal: Soft. Bowel sounds are normal. He exhibits no distension. There is no tenderness.  Musculoskeletal: Normal range of motion. He exhibits no edema.  Neurological: He is  alert and oriented to person, place, and time. No cranial nerve deficit. He exhibits normal muscle tone. Coordination normal.  Skin: Skin is warm and dry. He is not diaphoretic.  Nursing note and vitals reviewed.    ED Treatments / Results  Labs (all labs ordered are listed, but only abnormal results are displayed) Labs Reviewed  BASIC METABOLIC PANEL  CBC WITH DIFFERENTIAL/PLATELET    EKG None  Radiology No results found.  Procedures Procedures (including critical care time)  Medications Ordered in ED Medications - No data to display   Initial Impression / Assessment and Plan / ED Course  I have reviewed the triage vital signs and the nursing notes.  Pertinent labs & imaging results that were available during my care of the patient were reviewed by me and considered in my medical decision making (see chart for details).  Patient presents here with complaints of dizziness that occurred this evening at home.  His symptoms are now resolving and he feels better.  His vital signs have been stable and orthostatics are unremarkable.  Laboratory studies are reassuring and EKG is unchanged.  I am uncertain as to the exact etiology of this dizziness, however nothing appears emergent.  At this point, he will be discharged, to return as needed for any problems.  Final Clinical Impressions(s) / ED Diagnoses   Final diagnoses:  None    ED Discharge Orders    None       Geoffery Lyonselo, Pratt Bress, MD 02/24/18 [phone]

## 2018-02-24 NOTE — ED Notes (Signed)
Discharge reviewed with patient.  Placed in B hallway until his ride arrives.  Expected after 7am.

## 2018-02-24 NOTE — ED Triage Notes (Signed)
Brought by ems from home with c/o feeling shaky.  Patient also endorses feeling dizzy.  Started 2 hours pta.  Alert and oriented in triage.

## 2018-02-24 NOTE — ED Notes (Signed)
Ambulated in hall to bathroom.  Steady gait noted.

## 2018-02-24 NOTE — Discharge Instructions (Addendum)
Follow-up with your primary doctor if symptoms do not resolve in the next few days, and return to the ER if symptoms significantly worsen or change.

## 2018-03-05 ENCOUNTER — Other Ambulatory Visit: Payer: Self-pay | Admitting: Internal Medicine

## 2018-03-05 DIAGNOSIS — I10 Essential (primary) hypertension: Secondary | ICD-10-CM

## 2018-06-15 ENCOUNTER — Ambulatory Visit (INDEPENDENT_AMBULATORY_CARE_PROVIDER_SITE_OTHER): Payer: Medicare Other | Admitting: Family Medicine

## 2018-06-15 VITALS — BP 145/80 | HR 54 | Temp 97.8°F | Wt 165.0 lb

## 2018-06-15 DIAGNOSIS — I10 Essential (primary) hypertension: Secondary | ICD-10-CM | POA: Diagnosis not present

## 2018-06-15 DIAGNOSIS — G44229 Chronic tension-type headache, not intractable: Secondary | ICD-10-CM

## 2018-06-15 DIAGNOSIS — Z23 Encounter for immunization: Secondary | ICD-10-CM

## 2018-06-15 DIAGNOSIS — N529 Male erectile dysfunction, unspecified: Secondary | ICD-10-CM | POA: Diagnosis not present

## 2018-06-15 MED ORDER — AMLODIPINE BESYLATE 5 MG PO TABS: 5.0000 mg | ORAL_TABLET | Freq: Every day | ORAL | 0 refills | Status: DC

## 2018-06-15 MED ORDER — SILDENAFIL CITRATE 100 MG PO TABS: 100.0000 mg | ORAL_TABLET | Freq: Every day | ORAL | 0 refills | Status: DC | PRN
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
## 2018-06-15 NOTE — Patient Instructions (Signed)
It was great to meet you today! Thank you for letting me participate in your care!  Today, we discussed your recent headaches but your exam was very reassuring. I think this will Tylenol Extra Strength as needed. If they get worse or become more frequent or more intense please call our office and come back for an appointment.  Be well, Jules Schick, DO PGY-2, [facility]   Tension Headache A tension headache is pain, pressure, or aching that is felt over the front and sides of your head. These headaches can last from 30 minutes to several days. Follow these instructions at home: Managing pain  Take over-the-counter and prescription medicines only as told by your doctor.  Lie down in a dark, quiet room when you have a headache.  If directed, apply ice to your head and neck area: ? Put ice in a plastic bag. ? Place a towel between your skin and the bag. ? Leave the ice on for 20 minutes, 2-3 times per day.  Use a heating pad or a hot shower to apply heat to your head and neck area as told by your doctor. Eating and drinking  Eat meals on a regular schedule.  Do not drink a lot of alcohol.  Do not use a lot of caffeine, or stop using caffeine. General instructions  Keep all follow-up visits as told by your doctor. This is important.  Keep a journal to find out if certain things bring on headaches. For example, write down: ? What you eat and drink. ? How much sleep you get. ? Any change to your diet or medicines.  Try getting a massage, or doing other things that help you to relax.  Lessen stress.  Sit up straight. Do not tighten (tense) your muscles.  Do not use tobacco products. This includes cigarettes, chewing tobacco, or e-cigarettes. If you need help quitting, ask your doctor.  Exercise regularly as told by your doctor.  Get enough sleep. This may mean 7-9 hours of sleep. Contact a doctor if:  Your symptoms are not helped by medicine.  You have a  headache that feels different from your usual headache.  You feel sick to your stomach (nauseous) or you throw up (vomit).  You have a fever. Get help right away if:  Your headache becomes very bad.  You keep throwing up.  You have a stiff neck.  You have trouble seeing.  You have trouble speaking.  You have pain in your eye or ear.  Your muscles are weak or you lose muscle control.  You lose your balance or you have trouble walking.  You feel like you will pass out (faint) or you pass out.  You have confusion. This information is not intended to replace advice given to you by your health care provider. Make sure you discuss any questions you have with your health care provider. Document Released: 10/26/2009 Document Revised: 03/31/2016 Document Reviewed: 11/24/2014 Elsevier Interactive Patient Education  [employer].

## 2018-06-20 DIAGNOSIS — R51 Headache: Secondary | ICD-10-CM

## 2018-06-20 DIAGNOSIS — R519 Headache, unspecified: Secondary | ICD-10-CM | POA: Insufficient documentation

## 2018-06-20 NOTE — Assessment & Plan Note (Signed)
Given age he is controlled with systolic below 150 and diastolic below 90. - Cont amlodipine 5mg  daily

## 2018-06-20 NOTE — Assessment & Plan Note (Signed)
No active chest pain or heart disease that he is aware of. - Sildenafil 100mg  PRN, not to exceed once per day

## 2018-06-20 NOTE — Assessment & Plan Note (Signed)
Most likely tension type headache given he has no neurological deficits and no symptoms consistent with migraine headaches. No red flags such as thunderclap headache or "worst headache" of his life.  - Tylenol ES as needed up to 4 times daily, also instructed to use heating pad or OTC cream to help with muscle tension in the neck. - Patient instructed to return if no improvement or if symptoms worsen or changes significantly.

## 2018-07-02 ENCOUNTER — Emergency Department (HOSPITAL_COMMUNITY): Payer: Medicare Other

## 2018-07-02 ENCOUNTER — Emergency Department (HOSPITAL_COMMUNITY)
Admission: EM | Admit: 2018-07-02 | Discharge: 2018-07-02 | Disposition: A | Discharge status: Home / Self Care | Payer: Medicare Other | Attending: Emergency Medicine | Admitting: Emergency Medicine

## 2018-07-02 ENCOUNTER — Encounter (HOSPITAL_COMMUNITY): Payer: Self-pay | Admitting: Emergency Medicine

## 2018-07-02 DIAGNOSIS — J189 Pneumonia, unspecified organism: Secondary | ICD-10-CM | POA: Diagnosis not present

## 2018-07-02 DIAGNOSIS — Z79899 Other long term (current) drug therapy: Secondary | ICD-10-CM | POA: Insufficient documentation

## 2018-07-02 DIAGNOSIS — I5032 Chronic diastolic (congestive) heart failure: Secondary | ICD-10-CM | POA: Insufficient documentation

## 2018-07-02 DIAGNOSIS — E7800 Pure hypercholesterolemia, unspecified: Secondary | ICD-10-CM | POA: Insufficient documentation

## 2018-07-02 DIAGNOSIS — R9431 Abnormal electrocardiogram [ECG] [EKG]: Secondary | ICD-10-CM | POA: Diagnosis not present

## 2018-07-02 DIAGNOSIS — I110 Hypertensive heart disease with heart failure: Secondary | ICD-10-CM | POA: Insufficient documentation

## 2018-07-02 DIAGNOSIS — R0602 Shortness of breath: Secondary | ICD-10-CM | POA: Diagnosis not present

## 2018-07-02 DIAGNOSIS — J181 Lobar pneumonia, unspecified organism: Secondary | ICD-10-CM | POA: Diagnosis not present

## 2018-07-02 DIAGNOSIS — E785 Hyperlipidemia, unspecified: Secondary | ICD-10-CM | POA: Diagnosis not present

## 2018-07-02 DIAGNOSIS — R05 Cough: Secondary | ICD-10-CM | POA: Diagnosis not present

## 2018-07-02 LAB — CBC WITH DIFFERENTIAL/PLATELET
Abs Immature Granulocytes: 0.03 10*3/uL (ref 0.00–0.07)
BASOS ABS: 0 10*3/uL (ref 0.0–0.1)
Basophils Relative: 1 %
Eosinophils Absolute: 0.1 10*3/uL (ref 0.0–0.5)
Eosinophils Relative: 3 %
HEMATOCRIT: 47.7 % (ref 39.0–52.0)
HEMOGLOBIN: 15.2 g/dL (ref 13.0–17.0)
IMMATURE GRANULOCYTES: 1 %
LYMPHS ABS: 1.2 10*3/uL (ref 0.7–4.0)
LYMPHS PCT: 25 %
MCH: 31 pg (ref 26.0–34.0)
MCHC: 31.9 g/dL (ref 30.0–36.0)
MCV: 97.3 fL (ref 80.0–100.0)
MONOS PCT: 10 %
Monocytes Absolute: 0.5 10*3/uL (ref 0.1–1.0)
NRBC: 0 % (ref 0.0–0.2)
Neutro Abs: 2.8 10*3/uL (ref 1.7–7.7)
Neutrophils Relative %: 60 %
Platelets: 227 10*3/uL (ref 150–400)
RBC: 4.9 MIL/uL (ref 4.22–5.81)
RDW: 11.9 % (ref 11.5–15.5)
WBC: 4.6 10*3/uL (ref 4.0–10.5)

## 2018-07-02 LAB — BRAIN NATRIURETIC PEPTIDE: B Natriuretic Peptide: 22.2 pg/mL (ref 0.0–100.0)

## 2018-07-02 LAB — COMPREHENSIVE METABOLIC PANEL
ALBUMIN: 4.1 g/dL (ref 3.5–5.0)
ALK PHOS: 85 U/L (ref 38–126)
ALT: 13 U/L (ref 0–44)
AST: 21 U/L (ref 15–41)
Anion gap: 8 (ref 5–15)
BUN: 12 mg/dL (ref 8–23)
CALCIUM: 9.2 mg/dL (ref 8.9–10.3)
CO2: 25 mmol/L (ref 22–32)
CREATININE: 1.3 mg/dL — AB (ref 0.61–1.24)
Chloride: 103 mmol/L (ref 98–111)
GFR calc Af Amer: 55 mL/min — ABNORMAL LOW (ref >60)
GFR calc non Af Amer: 47 mL/min — ABNORMAL LOW (ref >60)
GLUCOSE: 125 mg/dL — AB (ref 70–99)
Potassium: 3.9 mmol/L (ref 3.5–5.1)
SODIUM: 136 mmol/L (ref 135–145)
Total Bilirubin: 0.9 mg/dL (ref 0.3–1.2)
Total Protein: 7.7 g/dL (ref 6.5–8.1)

## 2018-07-02 LAB — I-STAT TROPONIN, ED: Troponin i, poc: 0.02 ng/mL (ref 0.00–0.08)

## 2018-07-02 MED ORDER — AMOXICILLIN-POT CLAVULANATE ER 1000-62.5 MG PO TB12: 2.0000 | ORAL_TABLET | Freq: Two times a day (BID) | ORAL | 0 refills | Status: AC

## 2018-07-02 MED ORDER — DOXYCYCLINE HYCLATE 100 MG PO CAPS: 100.0000 mg | ORAL_CAPSULE | Freq: Two times a day (BID) | ORAL | 0 refills | Status: AC

## 2018-07-02 MED ORDER — BENZONATATE 100 MG PO CAPS: 100.0000 mg | ORAL_CAPSULE | Freq: Three times a day (TID) | ORAL | 0 refills | Status: DC | PRN

## 2018-07-02 NOTE — ED Provider Notes (Signed)
[facility] EMERGENCY DEPARTMENT Provider Note   CSN: [id number] Arrival date & time: 07/02/18  1037     History   Chief Complaint Chief Complaint  Patient presents with  . Shortness of Breath    HPI Paul [name] is a 84 y.o. male with a history of hypertension, hypercholesteremia, Paget's disease, heart failure, erectile dysfunction, and GERD who presents to the emergency department with a chief complaint of dyspnea x3 days.  The patient endorses progressively worsening dyspnea, onset 3 days ago.  He reports he was at first feeling short of breath with exertion, but is now endorsing some mild shortness of breath at rest.  He also endorses a productive cough and pain in the middle of his back that he characterizes as constant and sharp.  No known aggravating or alleviating factors for back pain.  No recent falls, trauma, or new exercises.  He denies fevers, chills, chest pain, leg swelling, palpitations, abdominal pain, nausea, vomiting, diarrhea, headache, dizziness, or lightheadedness.  No treatment prior to arrival.  No known sick contacts.  He states he is also had more frequent sneezing recently.  He states that it may be due to his wife and his stepson snorting heroin in their home.  He reports that he tries not to be in the same part of the house when they are doing it, but he is concerned it may be traveling.  The history is provided by the patient. No language interpreter was used.    Past Medical History:  Diagnosis Date  . Hypercholesteremia   . Hypertension     Patient Active Problem List   Diagnosis Date Noted  . Headache 06/20/2018  . Acute diastolic heart failure (HCC) 07/09/2013  . Shortness of breath 05/28/2013  . Erectile dysfunction 02/14/2012  . PAGET'S DISEASE 08/17/2010  . ANKLE PAIN, LEFT 02/16/2010  . BENIGN PROSTATIC HYPERTROPHY, WITH URINARY OBSTRUCTION 03/09/2009  . URINARY INCONTINENCE 03/09/2009  . HYPERLIPIDEMIA 11/28/2008  .  INSOMNIA 11/28/2008  . Essential hypertension 11/13/2008  . GASTROESOPHAGEAL REFLUX DISEASE, MILD 11/13/2008  . CONSTIPATION, CHRONIC 11/13/2008    History reviewed. No pertinent surgical history.      Home Medications    Prior to Admission medications   Medication Sig Start Date End Date Taking? Authorizing Provider  amLODipine (NORVASC) 5 MG tablet Take 1 tablet (5 mg total) by mouth daily. 06/15/18  Yes Lockamy, Timothy, DO  polyethylene glycol powder (GLYCOLAX/MIRALAX) powder Take 1 teaspoon daily to have one soft bowel movement daily. You can increase amount daily as needed. 10/19/17  Yes [name], MD  amoxicillin-clavulanate (AUGMENTIN XR) 1000-62.5 MG 12 hr tablet Take 2 tablets by mouth 2 (two) times daily for 5 days. 07/02/18 07/07/18  Olyn Landstrom A, PA-C  benzonatate (TESSALON) 100 MG capsule Take 1 capsule (100 mg total) by mouth 3 (three) times daily as needed for cough. 07/02/18   Rodricus Candelaria A, PA-C  doxycycline (VIBRAMYCIN) 100 MG capsule Take 1 capsule (100 mg total) by mouth 2 (two) times daily for 5 days. 07/02/18 07/07/18  Britini Garcilazo, Coral Else, PA-C    Family History Family History  Problem Relation Age of Onset  . Diabetes Mother   . Cancer Father   . Dementia Brother   . Cancer Sister        Lung    Social History Social History   Tobacco Use  . Smoking status: Never Smoker  . Smokeless tobacco: Never Used  Substance Use Topics  . Alcohol use:  No  . Drug use: No     Allergies   Patient has no known allergies.   Review of Systems Review of Systems  Constitutional: Negative for appetite change, chills and fever.  Eyes: Negative for visual disturbance.  Respiratory: Positive for cough and shortness of breath.   Cardiovascular: Negative for chest pain, palpitations and leg swelling.  Gastrointestinal: Negative for abdominal pain, diarrhea, nausea and vomiting.  Genitourinary: Negative for dysuria.  Musculoskeletal: Positive for back  pain. Negative for myalgias, neck pain and neck stiffness.  Skin: Negative for rash.  Allergic/Immunologic: Negative for immunocompromised state.  Neurological: Negative for dizziness, seizures, weakness and headaches.  Psychiatric/Behavioral: Negative for confusion.     Physical Exam Updated Vital Signs BP 134/61   Pulse (!) 57   Temp 97.6 F (36.4 C) (Oral)   Resp 13   Wt 74.5 kg   SpO2 98%   BMI 27.33 kg/m   Physical Exam  Constitutional: He appears well-developed. No distress.  Sitting upright.  Appears comfortable.  HENT:  Head: Normocephalic.  Right Ear: External ear normal.  Left Ear: External ear normal.  Nose: Nose normal.  Mouth/Throat: Oropharynx is clear and moist.  Eyes: Conjunctivae are normal.  Neck: Neck supple.  Cardiovascular: Normal rate, regular rhythm, normal heart sounds and intact distal pulses. Exam reveals no gallop and no friction rub.  No murmur heard. Pulmonary/Chest: Effort normal. No stridor. No respiratory distress. He has no wheezes.  Crackles in the left lower lung base.  Lungs are otherwise clear to auscultation bilaterally.  Good effort with inspiration and expiration.  No increased work of breathing, including no nasal flaring, retractions, or accessory muscle use.  Speaking in complete, fluent sentences without difficulty.  Abdominal: Soft. He exhibits no distension and no mass. There is no tenderness. There is no rebound and no guarding. No hernia.  Musculoskeletal:  Tenderness to palpation to the cervical, thoracic, or lumbar spinous processes or bilateral paraspinal muscles.  No rash to the overlying skin of the back.  No CVA tenderness bilaterally.  Neurological: He is alert.  Skin: Skin is warm and dry. Capillary refill takes less than 2 seconds. He is not diaphoretic. No erythema. No pallor.  Psychiatric: His behavior is normal.  Nursing note and vitals reviewed.    ED Treatments / Results  Labs (all labs ordered are listed,  but only abnormal results are displayed) Labs Reviewed  COMPREHENSIVE METABOLIC PANEL - Abnormal; Notable for the following components:      Result Value   Glucose, Bld 125 (*)    Creatinine, Ser 1.30 (*)    GFR calc non Af Amer 47 (*)    GFR calc Af Amer 55 (*)    All other components within normal limits  CBC WITH DIFFERENTIAL/PLATELET  BRAIN NATRIURETIC PEPTIDE  I-STAT TROPONIN, ED    EKG EKG Interpretation  Date/Time:  Monday July 02 2018 11:18:24 EST Ventricular Rate:  61 PR Interval:    QRS Duration: 96 QT Interval:  436 QTC Calculation: 440 R Axis:   34 Text Interpretation:  Sinus rhythm Atrial premature complex Low voltage, precordial leads Abnormal R-wave progression, early transition Baseline wander in lead(s) V4 No significant change since last tracing Confirmed by Jacalyn Lefevre [phone]) on 07/02/2018 11:25:52 AM   Radiology Dg Chest 2 View  Result Date: 07/02/2018 CLINICAL DATA:  Dry cough, shortness of breath and back pain EXAM: CHEST - 2 VIEW COMPARISON:  06/22/2016 FINDINGS: The heart size appears normal. No pleural effusion or  edema. Aortic atherosclerosis. There is mild spondylosis within the thoracic spine. IMPRESSION: 1. No acute cardiopulmonary abnormalities. 2.  Aortic Atherosclerosis (ICD10-I70.0). Electronically Signed   By: Signa Kellaylor  Stroud M.D.   On: 07/02/2018 12:42    Procedures Procedures (including critical care time)  Medications Ordered in ED Medications - No data to display   Initial Impression / Assessment and Plan / ED Course  I have reviewed the triage vital signs and the nursing notes.  Pertinent labs & imaging results that were available during my care of the patient were reviewed by me and considered in my medical decision making (see chart for details).     84 year old male with a history of hypertension, hypercholesteremia, Paget's disease, heart failure, erectile dysfunction, and GERD presenting with dyspnea, productive  cough, and mid back pain for 3 days.  Constitutional symptoms.  He has crackles in the left lower lung base on exam, but exam is otherwise unremarkable.  Troponin is negative.  EKG with no significant changes.  Creatinine is 1.3 around the patient's baseline.  BNP is 22.  Delta troponin is not indicated based on the length of time the patient's symptoms have been constant and ongoing.  Labs are otherwise reassuring.  Chest x-ray is unremarkable, but based on the patient's history and exam, will treat for clinical.  Try to discharge, the patient was ambulated on pulse ox and SaO2 remained greater than 97 on room air and the patient had no increased work of breathing per nursing staff. Based on the new community-acquired pneumonia guidelines, since the patient is greater than 65 we will treat with doxycycline and Augmentin XR.  The patient's creatinine clearance is normal and no dose adjustment is required.  Discussed this plan with Dr. Particia NearingHaviland, attending physician.  She is in agreement with the work-up and plan and has also independently evaluated the patient.  We will also discharge home with a cough suppressant.  The patient has also been counseled on avoiding heroin in the patient's home.  He reports that he does feel safe at home at this time.  I have recommended he follow-up with primary care for a recheck in 2 to 3 days.  He is in agreement with the work-up and plan.  He is hemodynamically stable and in no acute distress.  He is safe for discharge home with outpatient follow-up at this time.  Final Clinical Impressions(s) / ED Diagnoses   Final diagnoses:  Community acquired pneumonia of right lower lobe of lung [facility](HCC)    ED Discharge Orders         Ordered    benzonatate (TESSALON) 100 MG capsule  3 times daily PRN     07/02/18 1355    doxycycline (VIBRAMYCIN) 100 MG capsule  2 times daily     07/02/18 1355    amoxicillin-clavulanate (AUGMENTIN XR) 1000-62.5 MG 12 hr tablet  2 times daily      07/02/18 1355           Emily Forse A, PA-C 07/03/18 1716    Jacalyn LefevreHaviland, Julie, MD 07/05/18 [phone]

## 2018-07-02 NOTE — ED Notes (Signed)
Pt ambulated with RN, sats maintained >97% on RA

## 2018-07-02 NOTE — Discharge Instructions (Addendum)
Thank you for allowing me to care for you today in the Emergency Department.   Take 1 tablet of Augmentin 2 times daily for the next 5 days for pneumonia.  This is an antibiotic.  Doxycycline is also an antibiotic.  You should take 1 capsule of this medication by mouth 2 times daily for the next 5 days as well.  For cough, take 1 capsule of benzonatate by mouth every 8 hours as needed to help with your cough.  I have listed the family medicine practice office information above.  Please call their office to schedule a follow-up appointment for a recheck in the next 2 to 3 days.  Take 650 mg of Tylenol every 6 hours for fever or pain.  Return to the emergency department if you develop significantly worsening shortness of breath, chest pain, high fever despite taking Tylenol, new numbness or weakness, respiratory distress, or other new, concerning symptoms.

## 2018-07-02 NOTE — ED Triage Notes (Signed)
Pt here with c/o sob and sharp back pain between his shoulder blades times 3 days , no fevers but has had a productive cough

## 2018-09-15 ENCOUNTER — Other Ambulatory Visit: Payer: Self-pay | Admitting: Family Medicine

## 2018-09-15 DIAGNOSIS — I10 Essential (primary) hypertension: Secondary | ICD-10-CM

## 2018-09-18 ENCOUNTER — Encounter (HOSPITAL_COMMUNITY): Payer: Self-pay

## 2018-09-18 ENCOUNTER — Emergency Department (HOSPITAL_COMMUNITY)
Admission: EM | Admit: 2018-09-18 | Discharge: 2018-09-18 | Disposition: A | Discharge status: Home / Self Care | Payer: Medicare Other | Attending: Emergency Medicine | Admitting: Emergency Medicine

## 2018-09-18 DIAGNOSIS — R0981 Nasal congestion: Secondary | ICD-10-CM | POA: Diagnosis not present

## 2018-09-18 DIAGNOSIS — Z5321 Procedure and treatment not carried out due to patient leaving prior to being seen by health care provider: Secondary | ICD-10-CM | POA: Insufficient documentation

## 2018-09-18 NOTE — ED Triage Notes (Signed)
Pt presents for evaluation of head congestion. Denies headache, fevers, cough.

## 2018-09-18 NOTE — ED Notes (Signed)
Called pt name x3. No response from pt.  

## 2018-09-18 NOTE — ED Notes (Signed)
Called for room, no answer

## 2018-09-20 ENCOUNTER — Other Ambulatory Visit: Payer: Self-pay | Admitting: *Deleted

## 2018-09-20 NOTE — Patient Outreach (Signed)
[facility]) Care Management  09/20/2018  Paul KelpRobert [name] 03/16/1927 [id number]   Telephone Screen  Referral Date:09/20/2018 Referral Source: united health care medicare [facility] Ref[facility] Reason: Paul [name]" Reached Paul [name] member @ [phone]. Was seen in ED on 09/18/2018 due to head cold and congestion. Ended up leaving before any treatment or DX due to transportation. Drives a moped and did not want to get stuck in traffic. Member did not go to PCP reports that he does not have one and would like assistance finding a PCP" Insurance:united health care medicare  [place] admissions x 0 ED visits x 3 on 09/18/2018-head congestion, 07/02/2018-sob and 02/24/2018-dizziness   Outreach attempt # 1 contact #1 successful at [phone] Patient is able to verify HIPAA Reviewed and addressed referral to Dr. Pila'S [name] with patient  Transportation Paul [name] reports speaking with [facility] staff but states he does not need transportation to medical appointments because he has his moped as transportation He explains he just prefers to not get caught in traffic in his moped for safety reasons. He refuses transportation resources or assistance after [facility] RN Cm reviewed possible available resources/ assistance via Paul [name] SW  PCP/"Family doctors are expensive" per Paul [name] and he reports having an apointment every monthly. Epic does not indicate that he is being seen monthly by a [facility] provider. He reports he goes to Paul [name] "through the emergency room entrance" and has seen providers   He is unable to provide the address of his medical provider and does not think it is the [facility] or [facility].  He reports being on Social security and not being able to afford a "family doctor" but agrees to assistance in finding a primary care provider if he would be "able to afford it", "Who's going to pay for it?" He does not acknowledge that he was seen at the ED on 09/18/2018 vs  who he states is his "doctor" He states he went to see his "doctor" He reports "where I go the doctors change a lot. I had a woman doctor but now she is gone." He can not recall the name of any of his medical providers now nor in the past  [facility] RN CM discussed the differences and benefits in a primary care provider and visits to an ED and/or urgent care center related to cost and time seen. He voiced not understanding and reports he does not pay when he goes to his "doctor" [facility] RN CM attempted to have Paul [name] to view his insurance card to explain co pays but he voices not understanding  CM offered to assist him with establishing care at a primary care provider office and he does confirm he prefers appointments between 1330 and 1400     Social: Paul [name] reports he has been in [name] for 25- 30 years He denies issues with transportation to medical appointments He states he uses his moped   Conditions: HTN, HDL, Paget's disease, heart failure, erectile dysfunction, GERD, headaches, sob, left ankle pain, BPH, urinary incontinence, insomnia, constipation   Consent: [facility] RN CM reviewed [facility] services with patient. Patient gave verbal consent for services.  Plan: [facility] RN CM will collaborate with united health care staff who sent the referral, will attempt to speak with someone at [facility] for collaterol information (check on possible establishmenet of care) and attempt to engage Paul [name] again within the next 4 business days  [facility] RN  CM sent a successful outreach letter as discussed with [facility] brochure enclosed for review   Paul Graley L. Noelle Penner, RN, BSN, CCM [facility] Telephonic Care Management Care Coordinator Office number [phone] Mobile number [phone]  Main [facility] number [phone] Fax number [phone]

## 2018-09-27 ENCOUNTER — Other Ambulatory Visit: Payer: Self-pay | Admitting: *Deleted

## 2018-09-27 NOTE — Patient Outreach (Addendum)
[name]) Care Management  09/27/2018  [name] Nov 19, 1926 [id number]   Care coordination/follow up with Paul [name]  [facility] RN CM left a voice message at Paul [name]'s family doctor's number Pending a return call to discuss the UM referral concerns   Referral Date:09/20/2018 Referral Source: united health care medicare UM referral-[name] Referral Reason: Paul [name]. Was seen in Paul [name] on 09/18/2018 due to head cold and congestion. Ended up leaving before any treatment or DX due to transportation. Drives a moped and did not want to get stuck in traffic. Member did not go to PCP reports that he does not have one and would like assistance finding a PCP" Insurance:united health care medicare  [place] admissions x 0 Paul [name] visits x 3 on 09/18/2018-head congestion, 07/02/2018-sob and 02/24/2018-dizziness  Outreach #2 successful at his home number  Patient is able to verify HIPAA Reviewed and addressed  Original UM referral to [name] with patient  CM inquired again if he understood that he was at Paul [name]) [name] vs his [name] and he said he thought on 09/18/2018 he was at [name]. CM explained to him that the [place] [name] and the [name] primary care office are very near one another.   Paul [name] said "I'm guess I;m going to get a bill huh?, After he recognized that he went to the Paul [name] [name] vs the Paul [name] family practice office on 09/18/2018. He again reports he does have transportation to all medical appointments and the he just prefers to not get stuck in traffic. He informed CM today that now he understands why he was not seen by a MD and had to wait for a long time (From after 10 am to about 3 pm). "I wondered why it was taking so long and the nurse mentioned Paul [name] you are not so sick today." He again reports that even after the long wait he only left Paul [name] because he knew he had to get home and out of traffic and the inclement weather on his moped by a  certain time  CM reviewed with Paul [name] that there may be available resources via united healthcare medicare to assist him to get to medical appointments especially during times of pending inclement weather. He voiced interest in this but when Paul [name] SW services offered he reports that he would call united healthcare himself to get set up for transportation services prn When CM inquired what CM could do to help him with getting to medical appointments and not confusing the Paul [name] for the Paul [name] primary care practice again, He informed CM "By just what you told me now. I won't do that again cause I know I'm going to probably get a bill." He reports he will attempt to have someone take him to his MD office and "I won't go in the wrong area anymore"   He reports that he is feeling better today and was able to inform CM that he had went to Paul [name] to get evaluated for "congestion". He reports it is resolved now as he has been taking "Theraflu"  CM asked him again who his primary MD was and he reports "I have trouble remembering the names. I had a man then a woman but I think the woman's time is up. They don't usually stay long at my doctor's office." CM offered to help him locate another MD office closer to him but he does not prefer to change  MD office at this time.   Cm reminded Paul [name] of the Paul [name] outreach letter sent to him with Cm contact information and encouraged him to return a call at any time if he needs assistance especially with getting to his medical appointments or changing primary MD He voiced understanding and appreciation for the call from [facility] RN CM He has denied need of any services from [facility] staff at this time   Social: Paul [name] reports he has been in [name] for 25- 30 years.He lives with family.  He denies issues with transportation to medical appointments He states he uses his moped  Conditions: HTN, HDL, Paget's disease, heart failure, erectile dysfunction,GERD, headaches, sob, left ankle pain,  BPH, urinary incontinence, insomnia, constipation   Plan [facility] RN CM will attempt to speak with [facility] about Paul [name] mistakenly going to [name] for care, discuss possible options. Pending a return call from office staff.  CM will make another call attempt to the MD office within 4 business days if no return call and plan for case closure prn   Routed notes to MD listed in Epic and sent an Epic in basket to MD listed in [employer]  Paul L. Noelle PennerGibbs, RN, BSN, CCM [facility] Telephonic Care Management Care Coordinator Office number [phone] Mobile number [phone]  Main [facility] number [phone] Fax number [phone]

## 2018-09-28 ENCOUNTER — Telehealth: Payer: Self-pay

## 2018-09-28 NOTE — Telephone Encounter (Signed)
Returned call to Edd Arbour at [facility] regarding patient. States out of office until Monday. Left message with phone number to call back on 10/01/18.  Ples Specter, RN [facility] RN)

## 2018-10-01 ENCOUNTER — Telehealth: Payer: Self-pay

## 2018-10-01 ENCOUNTER — Other Ambulatory Visit: Payer: Self-pay | Admitting: *Deleted

## 2018-10-01 NOTE — Patient Outreach (Signed)
[facility] Care Management  10/01/2018  Paul [name] 1927-01-14 [id number]   Care coordination/case closure    [facility] RN CM received a message from Alisa from [facility] family practice office in CM absence CM returned a call and spoke with Paul [name] about pt recent ED visits with concern for possible confusion, memory concern Paul [name] states Paul [name] was last seen in November 2019 by Paul [name] who is a resident and who will be leaving after her 3 year practice. Paul [name] will receive a new resident in July 2020 Paul [name] offered to contact Paul [name] to offer an appointment to be seen. Discussed with Paul [name] that in CM last call to Paul [name] he did state he was feeling better.   Unable to determine if pt has hx of memory concerns CM voiced concern with Paul [name] about possible memory concerns and inquired about possible evaluation from MD office  Paul [name] inquired about family of patient and was informed that CM spoke primarily with Paul [name]   Plans case closure as discussed with Paul [name] on 09/27/2018  Paul Bradford L. Noelle Penner, RN, BSN, CCM [facility] Telephonic Care Management Care Coordinator Office number [phone] Mobile number [phone]  Main [facility] number [phone] Fax number [phone]

## 2018-10-01 NOTE — Telephone Encounter (Signed)
[facility] outreach called nurse line asking to call patient to schedule an apt with PCP.   Pt called and apt made for 2/18. Verified with patient he knew were to go. Pt voiced understanding.

## 2018-10-02 ENCOUNTER — Other Ambulatory Visit: Payer: Self-pay

## 2018-10-02 ENCOUNTER — Encounter: Payer: Self-pay | Admitting: Family Medicine

## 2018-10-02 ENCOUNTER — Ambulatory Visit (INDEPENDENT_AMBULATORY_CARE_PROVIDER_SITE_OTHER): Payer: Medicare Other | Admitting: Family Medicine

## 2018-10-02 VITALS — BP 128/64 | HR 62 | Temp 97.9°F | Wt 165.0 lb

## 2018-10-02 DIAGNOSIS — J302 Other seasonal allergic rhinitis: Secondary | ICD-10-CM | POA: Diagnosis not present

## 2018-10-02 DIAGNOSIS — R413 Other amnesia: Secondary | ICD-10-CM | POA: Diagnosis not present

## 2018-10-02 MED ORDER — FLUTICASONE PROPIONATE 50 MCG/ACT NA SUSP: 2.0000 | Freq: Every day | NASAL | 2 refills | Status: DC

## 2018-10-02 MED ORDER — CETIRIZINE HCL 5 MG PO TABS: 5.0000 mg | ORAL_TABLET | Freq: Every day | ORAL | 2 refills | Status: DC

## 2018-10-02 NOTE — Progress Notes (Signed)
Subjective:   Patient ID: Paul [name]    DOB: 1926-11-05, 85 y.o. male   MRN: [id number]  CC: sinus congestion, discuss memory issues   HPI: Paul [name] is a 85 y.o. male who presents to clinic today for the following issues.  Sinus allergies Started about 2 months ago, endorses runny nose and congestion.  No fever, chills or cough.  Endorses some sinus pressure but no associated pain.  Congestion seems to be worse in the morning when he wakes up and gets better throughout the day.  Endorses itchy watery eyes.  No headache or ear pain.  No nausea, vomiting or diarrhea. No known sick contacts.  He does not have any known allergies.   Has tried Theraflu powder with hot water but has not found much relief.     Memory concerns Received EPIC messages from [facility] regarding possible concern with patient's memory as he showed up to the ED instead of our practice when advised to follow up with his PCP. He had been scheduled in our office today to address this and to establish care with me as his PCP as we have not met before.   I reiterated with him that he has me as his PCP at our office.  He states he thought he did not have one here after Dr. Ola Spurr graduated the practice.  Addressed not having to go to the ED for a doctor visit and to come here instead, and he states he does not know why he did so, he was confused and sometimes forgets where he is due to his age.   Lives at home with his wife, however unfortunately she is battling with cancer and although she has been able to overcome this, he does not feel she would accompany him to his visits.   ROS: No fever, chills, nausea, vomiting.  No cough or SOB. +sinus congestion, +memory changes.   Social: pt is a never smoker.  Medications reviewed. Objective:   BP 128/64   Pulse 62   Temp 97.9 F (36.6 C) (Oral)   Wt 165 lb (74.8 kg)   SpO2 97%   BMI 27.46 kg/m  Vitals and nursing note reviewed.  General: pleasant 85 year old male,  NAD HEENT: NCAT, EOMI, PERRL, pale turbinates noted bilaterally, no rhinorrhea, MMM,  no tonsillar erythema or exudate Neck: supple, non-tender, normal ROM, no LAD  CV: RRR no MRG  Lungs: CTAB, normal effort  Abdomen: soft, NTND, +bs  Skin: warm, dry, no rash Extremities: warm and well perfused Neuro: alert, oriented x3  Psych: normal mood and affect   Assessment & Plan:   Seasonal allergic rhinitis Symptoms most likely c/w seasonal allergic rhinitis given itchy watery eyes and congestion which seems to be worse in the morning and later improves.  Low suspicion for acute sinusitis at this time and will forego treatment with antibiotics. No red flags on exam. No known triggers or allergens.  -Rx: flonase, directed on proper use  -Rx: Zyrtec 5 mg daily  Return precautions discussed.  Follow up as needed  Impaired memory Patient attributes this to his old age however would require further evaluation as he has recently been more forgetful.  Lives at home with his wife. Established care with him as PCP today and advised him to come to our office for visits instead of the ED.  -Scheduled in geriatric clinic on March 5 with Dr. McDiarmid -Advised him to bring a family member to the visit that may be able to  help address some of the concerns. He has support of his wife at home however does not feel she would come with him as she does not like going to the doctors. He does not have any other family in the area. If unable to have her come, would still encourage him to come alone so that we may address these issues.     Meds ordered this encounter  Medications  . fluticasone (FLONASE) 50 MCG/ACT nasal spray    Sig: Place 2 sprays into both nostrils daily.    Dispense:  16 g    Refill:  2  . cetirizine (ZYRTEC) 5 MG tablet    Sig: Take 1 tablet (5 mg total) by mouth daily.    Dispense:  60 tablet    Refill:  2   Lovenia Kim, MD [facility] PGY-3

## 2018-10-02 NOTE — Assessment & Plan Note (Signed)
Symptoms most likely c/w seasonal allergic rhinitis given itchy watery eyes and congestion which seems to be worse in the morning and later improves.  Low suspicion for acute sinusitis at this time and will forego treatment with antibiotics. No red flags on exam. No known triggers or allergens.  -Rx: flonase, directed on proper use  -Rx: Zyrtec 5 mg daily  Return precautions discussed.  Follow up as needed

## 2018-10-02 NOTE — Assessment & Plan Note (Addendum)
Patient attributes this to his old age however would require further evaluation as he has recently been more forgetful.  Lives at home with his wife. Established care with him as PCP today and advised him to come to our office for visits instead of the ED.  -Scheduled in geriatric clinic on March 5 with Dr. McDiarmid -Advised him to bring a family member to the visit that may be able to help address some of the concerns. He has support of his wife at home however does not feel she would come with him as she does not like going to the doctors. He does not have any other family in the area. If unable to have her come, would still encourage him to come alone so that we may address these issues.

## 2018-10-02 NOTE — Patient Instructions (Signed)
It was nice meeting you today.  You were seen in clinic for sinus congestion which is most likely related to seasonal allergies.  As we discussed, I am sending in Flonase and Zyrtec, both of which I want you to take every morning.  For the Flonase, do 2 sprays into each nostril.   If you have any new or worsening symptoms, please make an appointment to be seen again.  For your memory concerns, I would like you to follow-up in our geriatric clinic which you will be scheduled for.  I would strongly urge you to bring a family member that may be able to help.   Freddrick March MD

## 2018-10-18 ENCOUNTER — Ambulatory Visit: Payer: Medicare Other | Admitting: Physical Therapy

## 2018-10-18 ENCOUNTER — Ambulatory Visit: Payer: Medicare Other

## 2018-12-13 ENCOUNTER — Other Ambulatory Visit: Payer: Self-pay | Admitting: Family Medicine

## 2018-12-13 DIAGNOSIS — I10 Essential (primary) hypertension: Secondary | ICD-10-CM

## 2018-12-25 ENCOUNTER — Other Ambulatory Visit: Payer: Self-pay | Admitting: Family Medicine

## 2019-03-10 ENCOUNTER — Other Ambulatory Visit: Payer: Self-pay | Admitting: Family Medicine

## 2019-03-10 DIAGNOSIS — I10 Essential (primary) hypertension: Secondary | ICD-10-CM

## 2019-03-29 ENCOUNTER — Other Ambulatory Visit: Payer: Self-pay

## 2019-03-29 MED ORDER — CETIRIZINE HCL 5 MG PO TABS: 5.0000 mg | ORAL_TABLET | Freq: Every day | ORAL | 2 refills | Status: DC

## 2019-07-19 ENCOUNTER — Encounter: Payer: Self-pay | Admitting: Family Medicine

## 2019-07-19 ENCOUNTER — Other Ambulatory Visit: Payer: Self-pay

## 2019-07-19 ENCOUNTER — Ambulatory Visit (INDEPENDENT_AMBULATORY_CARE_PROVIDER_SITE_OTHER): Payer: Medicare Other | Admitting: Family Medicine

## 2019-07-19 VITALS — BP 115/75 | HR 75 | Wt 170.4 lb

## 2019-07-19 DIAGNOSIS — F4321 Adjustment disorder with depressed mood: Secondary | ICD-10-CM | POA: Diagnosis not present

## 2019-07-19 DIAGNOSIS — F321 Major depressive disorder, single episode, moderate: Secondary | ICD-10-CM | POA: Insufficient documentation

## 2019-07-19 DIAGNOSIS — R413 Other amnesia: Secondary | ICD-10-CM

## 2019-07-19 DIAGNOSIS — F4320 Adjustment disorder, unspecified: Secondary | ICD-10-CM

## 2019-07-19 DIAGNOSIS — J302 Other seasonal allergic rhinitis: Secondary | ICD-10-CM

## 2019-07-19 DIAGNOSIS — I10 Essential (primary) hypertension: Secondary | ICD-10-CM

## 2019-07-19 HISTORY — DX: Adjustment disorder, unspecified: F43.20

## 2019-07-19 HISTORY — DX: Adjustment disorder with depressed mood: F43.21

## 2019-07-19 NOTE — Assessment & Plan Note (Signed)
Previous LDL in 140s in 2014.  Not currently on a statin.  Declined reevaluation today, however low likelihood that statin would have benefit for primary prevention at his age.

## 2019-07-19 NOTE — Assessment & Plan Note (Addendum)
Suspect age-related cognitive decline, as reassuringly does not interfere with his daily function and not significantly progressive.  He is still quite active for his age, able to do all ADLs/iADLs without assistance, and managing finances for his family.  May have some contribution from his current depressive state due to family loss over the last year as discussed above.  Can consider further cognitive testing in the future if his forgetfulness progresses or worsens.

## 2019-07-19 NOTE — Patient Instructions (Signed)
It was wonderful seeing you today and getting to know you little bit better.  Your blood pressure looks great, we can continue with amlodipine/Norvasc as you are taking it.  If you start experiencing any extreme thirst, fatigue, nausea/abdominal pain, or do not feel like your normal self please make sure you follow-up and we can consider labs at that time.  Additionally, if you decide you would like to consider any medications or therapy as you are going through a tough time with your family right now, please touch base with me and we can get this started for you.  If you continue to do well, I would like to see you in 6 months for regular follow-up.

## 2019-07-19 NOTE — Progress Notes (Addendum)
Subjective:    Patient ID: Paul [name], male    DOB: 1927/01/29, 85 y.o.   MRN: [id number]   CC: Hypertension follow-up  HPI: Paul [name] is an 85 year old gentleman with a history of hypertension, BPH, and hyperlipidemia  presenting discuss the following:  Hypertension: He is here for follow-up of his controlled hypertension.  Says he feels great and has no concerns.  He currently takes Norvasc 5 mg and has been on this for quite some years, well-tolerated.  Follows a well-balanced diet, his daughter-in-law cooks for him at home.  He continues to exercise, lifts weights on a daily basis and also walks around his neighborhood every day.  He denies any chest pains, shortness of breath, lower extremity edema, orthopnea, lightheadedness/dizziness, headaches.  Cardiovascular risk factors including older age, male gender, hypertension, hyperlipidemia.  Appears to have history of CKD stage IIIa with GFR generally around 55, sometimes greater than 60.  Impaired memory follow up: It appears there is concern for impaired memory back in February 2020, however not overly concerned today.  Says he forgets things here and there, but has not impacted his safety.  He lives at home with his wife who currently is in remission for cancer and his 3 stepchildren (actually 5 total children in his house as to have spouses).  They have been there for the past 2 months due to financial troubles and loss of job.  He currently handles all the finances of the home, his children help with the cooking and cleaning.  He is still active every day.  Rides a motorcycle when it is not too cold outside.  He was a Psychologist, occupational for 40 years.  Walks without assistance.  Can do all of his ADLs on his own without concern.  Denies any hallucinations, visual or auditory.  Grief: During discussion, mentions that his sister passed away 3 weeks ago and that his brother passed away ~7 months ago.  He expresses great sadness to this, however mainly  expresses a sentiment that he knows that now he is "next " as he is a last living sibling.  Says he is handling the situation and not interested in any medications or therapy.  Denies any SI or HI.  Has his wife and children to confide in.   Smoking status reviewed  Review of Systems Per HPI    Objective:  BP 115/75   Pulse 75   Wt 170 lb 6.4 oz (77.3 kg)   SpO2 98%   BMI 28.36 kg/m  Vitals and nursing note reviewed  General: NAD, pleasant, appears younger than stated age Cardiac: RRR Respiratory: CTAB, normal effort Extremities: no edema or cyanosis. WWP. Skin: warm and dry, no rashes noted Neuro: alert and oriented, no focal deficits, speech clear, can follow normal conversation Psych: normal affect  PHQ-9 score of 12, answered 0 to #9.  Moderate depression.  Assessment & Plan:   Essential hypertension Well-controlled only on Norvasc 5 mg.  Denies any hypotensive symptomatology and has tolerated this medication well for the past several years.  Will continue as is.  Discussed rechecking his creatinine today given what appears to be history of CKD stage II/IIIa, however states he would rather "ride it out " then checking it again due to his extreme fear of needles.  Discussed the risks however he would like to hold off on blood work, consider obtaining labs if he has any new onset symptomatology.  Grief reaction In the setting of his sister and brother passing  away within the past 7 months, he is now the only living sibling. PHQ-9 score showing moderate depression without active/passive suicidal ideations.  He is not interested in behavioral therapy or medications at this time, believes he is managing this appropriately with the help of his family and wife.  Provided supportive listening.  Discussed his options including the above and let him know to reach back out to [place] if needed.  HYPERLIPIDEMIA Previous LDL in 140s in 2014.  Not currently on a statin.  Declined reevaluation  today, however low likelihood that statin would have benefit for primary prevention at his age.  Seasonal allergic rhinitis Continue Zyrtec as is.  When requires additional refills, will start sending in 90-day supplies for convenience.  Impaired memory Suspect age-related cognitive decline, as reassuringly does not interfere with his daily function and not significantly progressive.  He is still quite active for his age, able to do all ADLs/iADLs without assistance, and managing finances for his family.  May have some contribution from his current depressive state due to family loss over the last year as discussed above.  Can consider further cognitive testing in the future if his forgetfulness progresses or worsens.   Follow-up in 6 months or sooner if needed.  Reassuringly appears quite well for his age on minimal medications.  [facility] Resident PGY-2

## 2019-07-19 NOTE — Assessment & Plan Note (Signed)
In the setting of his sister and brother passing away within the past 7 months, he is now the only living sibling. PHQ-9 score showing moderate depression without active/passive suicidal ideations.  He is not interested in behavioral therapy or medications at this time, believes he is managing this appropriately with the help of his family and wife.  Provided supportive listening.  Discussed his options including the above and let him know to reach back out to [place] if needed.

## 2019-07-19 NOTE — Assessment & Plan Note (Addendum)
Well-controlled only on Norvasc 5 mg.  Denies any hypotensive symptomatology and has tolerated this medication well for the past several years.  Will continue as is.  Discussed rechecking his creatinine today given what appears to be history of CKD stage II/IIIa, however states he would rather "ride it out " then checking it again due to his extreme fear of needles.  Discussed the risks however he would like to hold off on blood work, consider obtaining labs if he has any new onset symptomatology.

## 2019-07-19 NOTE — Assessment & Plan Note (Signed)
Continue Zyrtec as is.  When requires additional refills, will start sending in 90-day supplies for convenience.

## 2019-10-18 ENCOUNTER — Other Ambulatory Visit: Payer: Self-pay | Admitting: *Deleted

## 2019-10-18 MED ORDER — CETIRIZINE HCL 5 MG PO TABS: 5.0000 mg | ORAL_TABLET | Freq: Every day | ORAL | 2 refills | Status: DC

## 2019-11-21 ENCOUNTER — Other Ambulatory Visit: Payer: Self-pay

## 2019-11-22 MED ORDER — FLUTICASONE PROPIONATE 50 MCG/ACT NA SUSP: NASAL | 2 refills | Status: DC

## 2020-02-10 ENCOUNTER — Other Ambulatory Visit: Payer: Self-pay | Admitting: *Deleted

## 2020-02-10 DIAGNOSIS — I10 Essential (primary) hypertension: Secondary | ICD-10-CM

## 2020-02-10 MED ORDER — AMLODIPINE BESYLATE 5 MG PO TABS: 5.0000 mg | ORAL_TABLET | Freq: Every day | ORAL | 3 refills | Status: DC

## 2020-02-19 ENCOUNTER — Other Ambulatory Visit: Payer: Self-pay

## 2020-02-19 MED ORDER — FLUTICASONE PROPIONATE 50 MCG/ACT NA SUSP: NASAL | 2 refills | Status: AC

## 2020-04-06 ENCOUNTER — Other Ambulatory Visit: Payer: Self-pay | Admitting: *Deleted

## 2020-04-06 MED ORDER — CETIRIZINE HCL 5 MG PO TABS: 5.0000 mg | ORAL_TABLET | Freq: Every day | ORAL | 2 refills | Status: DC

## 2020-07-13 NOTE — Progress Notes (Signed)
     SUBJECTIVE:   CHIEF COMPLAINT / HPI:   Paul [name] is a 86 y.o. male presents with concern for shingles.  Patient had been exposed to COVID the night before clinic visit so history was obtained over the phone and patient was seen in his car.  Rash  Pt first noticed pruritic rash on 2 days ago on the left side of the lower back. He described the rash as a "one small bump" and he "scratched the head off". Now it is a scab. Initially it was painful but now it is not painful but only pruritic. Denies bleeding/discharge/hemorrhage,headache, fever or malaise. Denies recent travel, outdoor hiking, insect bites, new laundry detergent, physical trauma. Denies history of autoimmune disease, transplant or HIV etc   PERTINENT  PMH / PSH: HTN, HLD, BPH   OBJECTIVE:   There were no vitals taken for this visit.   General: Alert, no acute distress, pleasant, well appearing, sat in car Cardio: well perfused Pulm: normal work of breathing Neuro: Cranial nerves grossly intact Skin: no obvious bullae, vesicles in left lateral mid thoracic region   ASSESSMENT/PLAN:   Rash No obvious rash in left lateral mid thoracic back  where patient was reporting symptoms. However limited examination as I saw the patient in the parking lot and could not expose the patient's skin adequately outside as it was very cold.  Low suspicion for shingles based on history alone. Recommended COVID test ASAP and patient should return to have his skin looked at in the clinic. Pt provided reassurance and he is happy with this plan.     Towanda Octave, MD PGY-2  [facility]

## 2020-07-14 ENCOUNTER — Ambulatory Visit (INDEPENDENT_AMBULATORY_CARE_PROVIDER_SITE_OTHER): Payer: Medicare Other | Admitting: Family Medicine

## 2020-07-14 ENCOUNTER — Other Ambulatory Visit: Payer: Self-pay

## 2020-07-14 ENCOUNTER — Telehealth: Payer: Self-pay | Admitting: Family Medicine

## 2020-07-14 DIAGNOSIS — R21 Rash and other nonspecific skin eruption: Secondary | ICD-10-CM

## 2020-07-14 DIAGNOSIS — Z719 Counseling, unspecified: Secondary | ICD-10-CM | POA: Diagnosis not present

## 2020-07-14 NOTE — Assessment & Plan Note (Addendum)
No obvious rash in left lateral mid thoracic back  where patient was reporting symptoms. However limited examination as I saw the patient in the parking lot and could not expose the patient's skin adequately outside as it was very cold.  Low suspicion for shingles based on history alone. Recommended COVID test ASAP and patient should return to have his skin looked at in the clinic. Pt provided reassurance and he is happy with this plan.

## 2020-07-14 NOTE — Telephone Encounter (Signed)
Attempted to call wife regarding visit today. Patient has been around family member who tested positive for COVID.

## 2020-11-03 ENCOUNTER — Telehealth: Payer: Self-pay

## 2020-11-03 NOTE — Telephone Encounter (Signed)
I connected by phone with Claud Kelp and/or patient's caregiver on 11/03/2020 at 11:58 AM to discuss the potential vaccination through our Homebound vaccination initiative.   Prevaccination Checklist for COVID-19 Vaccines  1.  Are you feeling sick today? no  2.  Have you ever received a dose of a COVID-19 vaccine?  no      If yes, which one? Moderna   How many dose of Covid-19 vaccine have your received and dates ? 2, 12/05/19, 01/02/20   Check all that apply: I live in a long-term care setting. no  I have been diagnosed with a medical condition(s). Please list: lives alone, very hard of hearing (pertinent to homebound status)  I am a first responder. no  I work in a long-term care facility, correctional facility, hospital, restaurant, retail setting, school, or other setting with high exposure to the public. no  4. Do you have a health condition or are you undergoing treatment that makes you moderately or severely immunocompromised? (This would include treatment for cancer or HIV, receipt of organ transplant, immunosuppressive therapy or high-dose corticosteroids, CAR-T-cell therapy, hematopoietic cell transplant [HCT], DiGeorge syndrome or Wiskott-Aldrich syndrome)  no  5. Have you received hematopoietic cell transplant (HCT) or CAR-T-cell therapies since receiving COVID-19 vaccine? no  6.  Have you ever had an allergic reaction: (This would include a severe reaction [ e.g., anaphylaxis] that required treatment with epinephrine or EpiPen or that caused you to go to the hospital.  It would also include an allergic reaction that occurred within 4 hours that caused hives, swelling, or respiratory distress, including wheezing.) A.  A previous dose of COVID-19 vaccine. no  B.  A vaccine or injectable therapy that contains multiple components, one of which is a COVID-19 vaccine component, but it is not known which component elicited the immediate reaction. no  C.  Are you allergic to polyethylene  glycol? no  D. Are you allergic to Polysorbate, which is found in some vaccines, film coated tablets and intravenous steroids?  no   7.  Have you ever had an allergic reaction to another vaccine (other than COVID-19 vaccine) or an injectable medication? (This would include a severe reaction [ e.g., anaphylaxis] that required treatment with epinephrine or EpiPen or that caused you to go to the hospital.  It would also include an allergic reaction that occurred within 4 hours that caused hives, swelling, or respiratory distress, including wheezing.)  no   8.  Have you ever had a severe allergic reaction (e.g., anaphylaxis) to something other than a component of the COVID-19 vaccine, or any vaccine or injectable medication?  This would include food, pet, venom, environmental, or oral medication allergies.  no   Check all that apply to you:  Am a male between ages 31 and 59 years old  no  Women 68 through 85 years of age can receive any FDA-authorized or -approved COVID-19 vaccine. However, they should be informed of the rare but increased risk of thrombosis with thrombocytopenia syndrome (TTS) after receipt of the [employer] Vaccine and the availability of other FDA-authorized and -approved COVID-19 vaccines. People who had TTS after a first dose of Janssen vaccine should not receive a subsequent dose of Janssen product    Am a male between ages 67 and 15 years old  no Males 5 through 85 years of age may receive the correct formulation of Pfizer-BioNTech COVID-19 vaccine. Males 18 and older can receive any FDA-authorized or -approved vaccine. However, people receiving an mRNA  COVID-19 vaccine, especially males 76 through 85 years of age and their parents/legal representative (when relevant), should be informed of the risk of developing myocarditis (an inflammation of the heart muscle) or pericarditis (inflammation of the lining around the heart) after receipt of an mRNA vaccine. The risk of  developing either myocarditis or pericarditis after vaccination is low, and lower than the risk of myocarditis associated with SARS-CoV-2 infection in adolescents and adults. Vaccine recipients should be counseled about the need to seek care if symptoms of myocarditis or pericarditis develop after vaccination     Have a history of myocarditis or pericarditis  no Myocarditis or pericarditis after receipt of the first dose of an mRNA COVID-19 vaccine series but before administration of the second dose  Experts advise that people who develop myocarditis or pericarditis after a dose of an mRNA COVID-19 vaccine not receive a subsequent dose of any COVID-19 vaccine, until additional safety data are available.  Administration of a subsequent dose of COVID-19 vaccine before safety data are available can be considered in certain circumstances after the episode of myocarditis or pericarditis has completely resolved. Until additional data are available, some experts recommend a Alphonsa Overall COVID-19 vaccine be considered instead of an mRNA COVID-19 vaccine. Decisions about proceeding with a subsequent dose should include a conversation between the patient, their parent/legal representative (when relevant), and their clinical team, which may include a cardiologist.    Have been treated with monoclonal antibodies or convalescent serum to prevent or treat COVID-19  no Vaccination should be offered to people regardless of history of prior symptomatic or asymptomatic SARS-CoV-2 infection. There is no recommended minimal interval between infection and vaccination.  However, vaccination should be deferred if a patient received monoclonal antibodies or convalescent serum as treatment for COVID-19 or for post-exposure prophylaxis. This is a precautionary measure until additional information becomes available, to avoid interference of the antibody treatment with vaccine-induced immune responses.  Defer COVID-19 vaccination for 30  days when a passive antibody product was used for post-exposure prophylaxis.  Defer COVID-19 vaccination for 90 days when a passive antibody product was used to treat COVID-19.     Diagnosed with Multisystem Inflammatory Syndrome (MIS-C or MIS-A) after a COVID-19 infection  no It is unknown if people with a history of MIS-C or MIS-A are at risk for a dysregulated immune response to COVID-19 vaccination.  People with a history of MIS-C or MIS-A may choose to be vaccinated. Considerations for vaccination may include:   Clinical recovery from MIS-C or MIS-A, including return to normal cardiac function   Personal risk of severe acute COVID-19 (e.g., age, underlying conditions)   High or substantial community transmission of SARS-CoV-2 and personal increased risk of reinfection.   Timing of any immunomodulatory therapies (general best practice guidelines for immunization can be consulted for more information [email or web address])   It has been 90 days or more since their diagnosis of MIS-C   Onset of MIS-C occurred before any COVID-19 vaccination   A conversation between the patient, their guardian(s), and their clinical team or a specialist may assist with COVID-19 vaccination decisions. Healthcare providers and health departments may also request a consultation from the [place] at [email or web address].     Have a bleeding disorder  no Take a blood thinner  no As with all vaccines, any COVID-19 vaccine product may be given to these patients, if a physician familiar with the patient's bleeding risk determines that the vaccine can be administered intramuscularly with reasonable  safety.  ACIP recommends the following technique for intramuscular vaccination in patients with bleeding disorders or taking blood thinners: a fine-gauge needle (23-gauge or smaller caliber)  should be used for the vaccination, followed by firm pressure on the site, without rubbing, for at least 2 minutes.  People who regularly take aspirin or anticoagulants as part of their routine medications do not need to stop these medications prior to receipt of any COVID-19 vaccine.    Have a history of heparin-induced thrombocytopenia (HIT)  no Although the etiology of TTS associated with the Alphonsa Overall COVID-19 vaccine is unclear, it appears to be similar to another rare immune-mediated syndrome, heparin-induced thrombocytopenia (HIT). People with a history of an episode of an immune-mediated syndrome characterized by thrombosis and thrombocytopenia, such as HIT, should be offered a currently FDA-approved or FDA-authorized mRNA COVID-19 vaccine if it has been ?90 days since their TTS resolved. After 90 days, patients may be vaccinated with any currently FDA-approved or FDA-authorized COVID-19 vaccine, including Janssen COVID-19 Vaccine. However, people who developed TTS after their initial Alphonsa Overall vaccine should not receive a Janssen booster dose.  Experts believe the following factors do not make people more susceptible to TTS after receipt of the [employer]. People with these conditions can be vaccinated with any FDA-authorized or - approved COVID-19 vaccine, including the [employer] COVID-19 Vaccine:   A prior history of venous thromboembolism   Risk factors for venous thromboembolism (e.g., inherited or acquired thrombophilia including Factor V Leiden; prothrombin gene 20210A mutation; antiphospholipid syndrome; protein C, protein S or antithrombin deficiency   A prior history of other types of thromboses not associated with thrombocytopenia   Pregnancy, post-partum status, or receipt of hormonal contraceptives (e.g., combined oral contraceptives, patch, ring)   Additional recipient education materials can be found at [email or web address]-[email or web address]/ vaccines/safety/JJUpdate.html.     Am currently pregnant or breastfeeding  no Vaccination is recommended for all people aged 56 years and older, including people that are:   Pregnant   Breastfeeding   Trying to get pregnant now or who might become pregnant in the future   Pregnant, breastfeeding, and post-partum people 78 through 85 years of age should be aware of the rare risk of TTS after receipt of the Alphonsa Overall COVID-19 Vaccine and the availability of other FDA-authorized or -approved COVID-19 vaccines (i.e., mRNA vaccines).    Have received dermal fillers  no FDA-authorized or -approved COVID-19 vaccines can be administered to people who have received injectable dermal fillers who have no contraindications for vaccination.  Infrequently, these people might experience temporary swelling at or near the site of filler injection (usually the face or lips) following administration of a dose of an mRNA COVID-19 vaccine. These people should be advised to contact their healthcare provider if swelling develops at or near the site of dermal filler following vaccination.     Have a history of Guillain-Barr Syndrome (GBS)  no People with a history of GBS can receive any FDA-authorized or -approved COVID-19 vaccine. However, given the possible association between the [employer] and an increased risk of GBS, a patient with a history of GBS and their clinical team should discuss the availability of mRNA vaccines to offer protection against COVID-19. The highest risk has been observed in men aged 38-64 years with symptoms of GBS beginning within 42 days after Alphonsa Overall COVID-19 vaccination.  People who had GBS after receiving Janssen vaccine should be made aware of the option to receive an mRNA COVID-19 vaccine booster at least 2 months (8  weeks) after the Janssen dose. However, Linwood Dibbles vaccine may be used as a booster, particularly if GBS occurred more than 42 days after vaccination or was related to a non-vaccine factor. Prior to  booster vaccination, a conversation between the patient and their clinical team may assist with decisions about use of a COVID-19 booster dose, including the timing of administration     Postvaccination Observation Times for People without Contraindications to Covid 19 Vaccination.  30 minutes:  People with a history of: A contraindication to another type of COVID-19 vaccine product (i.e., mRNA or viral vector COVID-19 vaccines)   Immediate (within 4 hours of exposure) non-severe allergic reaction to a COVID-19 vaccine or injectable therapies   Anaphylaxis due to any cause   Immediate allergic reaction of any severity to a non-COVID-19 vaccine   15 minutes: All other people  This patient is a 87 y.o. male that meets the FDA criteria to receive homebound vaccination. Patient or parent/caregiver understands they have the option to accept or refuse homebound vaccination.  Patient passed the pre-screening checklist and would like to proceed with homebound vaccination.  Based on questionnaire above, I recommend the patient be observed for 15 minutes.  There are an estimated #0 other household members/caregivers who are also interested in receiving the vaccine.    The patient has been confirmed homebound and eligible for homebound vaccination with the considerations outlined above. I will send the patient's information to our scheduling team who will reach out to schedule the patient and potential caregiver/family members for homebound vaccination.    Skip Mayer 11/03/2020 11:58 AM

## 2020-12-24 ENCOUNTER — Ambulatory Visit: Payer: Medicare Other | Attending: Critical Care Medicine

## 2020-12-24 DIAGNOSIS — Z23 Encounter for immunization: Secondary | ICD-10-CM

## 2020-12-24 NOTE — Progress Notes (Signed)
   Covid-19 Vaccination Clinic  Name:  Jovane Foutz    MRN: [id number] DOB: December 02, 1926  12/24/2020  Mr. Tally was observed post Covid-19 immunization for 15 minutes without incident. He was provided with Vaccine Information Sheet and instruction to access the V-Safe system.   Mr. Mozer was instructed to call 911 with any severe reactions post vaccine: Marland Kitchen Difficulty breathing  . Swelling of face and throat  . A fast heartbeat  . A bad rash all over body  . Dizziness and weakness   Immunizations Administered    Name Date Dose VIS Date Route   Moderna Covid-19 Booster Vaccine 12/24/2020 12:01 PM 0.25 mL 06/03/2020 Intramuscular   Manufacturer: Moderna   Lot: [id number]   NDC: 56861-683-72

## 2021-03-02 ENCOUNTER — Other Ambulatory Visit: Payer: Self-pay

## 2021-03-02 ENCOUNTER — Ambulatory Visit (INDEPENDENT_AMBULATORY_CARE_PROVIDER_SITE_OTHER): Payer: Medicare Other | Admitting: Family Medicine

## 2021-03-02 VITALS — BP 132/63 | HR 64 | Ht 65.0 in | Wt 163.8 lb

## 2021-03-02 DIAGNOSIS — I10 Essential (primary) hypertension: Secondary | ICD-10-CM | POA: Diagnosis not present

## 2021-03-02 DIAGNOSIS — K5909 Other constipation: Secondary | ICD-10-CM | POA: Diagnosis not present

## 2021-03-02 DIAGNOSIS — Z5941 Food insecurity: Secondary | ICD-10-CM

## 2021-03-02 DIAGNOSIS — F4321 Adjustment disorder with depressed mood: Secondary | ICD-10-CM

## 2021-03-02 DIAGNOSIS — E785 Hyperlipidemia, unspecified: Secondary | ICD-10-CM

## 2021-03-02 DIAGNOSIS — R7309 Other abnormal glucose: Secondary | ICD-10-CM | POA: Diagnosis not present

## 2021-03-02 DIAGNOSIS — J302 Other seasonal allergic rhinitis: Secondary | ICD-10-CM

## 2021-03-02 LAB — POCT GLYCOSYLATED HEMOGLOBIN (HGB A1C): Hemoglobin A1C: 5.5 % (ref 4.0–5.6)

## 2021-03-02 NOTE — Assessment & Plan Note (Signed)
Stable.  BP  at goal.  Medications: continue amlodipine 5 mg  Exercise: Encouraged to increase physical activity as tolerated.  Diet Pattern: Heart healthy dietary choices discussed.  BMP today

## 2021-03-02 NOTE — Assessment & Plan Note (Signed)
Elevated PHQ9: 10-13.  Patient reports thoughts of dying as many of his family members have passes away. Has no plan. States thoughts of dying having increased since his wife's passing 4-5 months ago. No plan. States "I ain't gonna hurt me" then laughs. Wishes to move closer to family. Has no close friends in the area. Wishes to be near more people around his age. Would like to find a senior center to interact with people. Pt is interested in grief counseling. Discussed referral to chronic care management and pt agreeable. CCM referral placed.

## 2021-03-02 NOTE — Assessment & Plan Note (Signed)
Stable. Continue Zyrtec.
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
## 2021-03-02 NOTE — Assessment & Plan Note (Signed)
Stable. Continue Miralax daily.

## 2021-03-02 NOTE — Patient Instructions (Signed)
It was great seeing you today!  Please check-out at the front desk before leaving the clinic. I'd like to see you back in 2 but if you need to be seen earlier than that for any new issues we're happy to fit you in, just give [place] a call!  Visit Remembers: - Stop by the pharmacy to pick up your prescriptions, when you need refills let us know - Continue to work on your healthy eating habits and incorporating exercise into your daily life.  - Your goal is to have an BP < 135/85 - Medicine Changes: none   Call [facility] for grief counseling at [phone].    Regarding lab work today:  Due to recent changes in healthcare laws, you may see the results of your imaging and laboratory studies on MyChart before your provider has had a chance to review them.  I understand that in some cases there may be results that are confusing or concerning to you. Not all laboratory results come back in the same time frame and you may be waiting for multiple results in order to interpret others.  Please give [place] 72 hours in order for your provider to thoroughly review all the results before contacting the office for clarification of your results. If everything is normal, you will get a letter in the mail or a message in My Chart. Please give [place] a call if you do not hear from [place] after 2 weeks.  Please bring all of your medications with you to each visit.    If you haven't already, sign up for My Chart to have easy access to your labs results, and communication with your primary care physician.  Feel free to call with any questions or concerns at any time, at [phone].   Take care,  Dr. Katherina [facility]

## 2021-03-03 ENCOUNTER — Telehealth: Payer: Self-pay | Admitting: *Deleted

## 2021-03-03 LAB — BASIC METABOLIC PANEL
BUN/Creatinine Ratio: 16 (ref 10–24)
BUN: 18 mg/dL (ref 10–36)
CO2: 24 mmol/L (ref 20–29)
Calcium: 9.4 mg/dL (ref 8.6–10.2)
Chloride: 99 mmol/L (ref 96–106)
Creatinine, Ser: 1.15 mg/dL (ref 0.76–1.27)
Glucose: 94 mg/dL (ref 65–99)
Potassium: 4.7 mmol/L (ref 3.5–5.2)
Sodium: 137 mmol/L (ref 134–144)
eGFR: 60 mL/min/{1.73_m2} (ref >59)

## 2021-03-03 LAB — LIPID PANEL
Chol/HDL Ratio: 5 ratio (ref 0.0–5.0)
Cholesterol, Total: 191 mg/dL (ref 100–199)
HDL: 38 mg/dL — ABNORMAL LOW (ref >39)
LDL Chol Calc (NIH): 112 mg/dL — ABNORMAL HIGH (ref 0–99)
Triglycerides: 237 mg/dL — ABNORMAL HIGH (ref 0–149)
VLDL Cholesterol Cal: 41 mg/dL — ABNORMAL HIGH (ref 5–40)

## 2021-03-03 NOTE — Chronic Care Management (AMB) (Signed)
  Care Management   Outreach Note  03/03/2021 Name: Paul [name] MRN: [id number] DOB: 1927/05/07  Referred by: Ronnald Ramp, MD Reason for referral : Care Coordination (Initial outreach to schedule referral with Licensed Clinical SW )   An unsuccessful telephone outreach was attempted today. The patient was referred to the case management team for assistance with care management and care coordination.   Follow Up Plan: The care management team will reach out to the patient again over the next 7 days.  If patient returns call to provider office, please advise to call Embedded Care Management Care Guide Gwenevere Ghazi at [phone].  Gwenevere Ghazi  Care Guide, Embedded Care Coordination [facility] Management

## 2021-03-04 ENCOUNTER — Emergency Department (HOSPITAL_COMMUNITY)
Admission: EM | Admit: 2021-03-04 | Discharge: 2021-03-05 | Disposition: A | Discharge status: Home / Self Care | Payer: Medicare Other | Attending: Student | Admitting: Student

## 2021-03-04 ENCOUNTER — Emergency Department (HOSPITAL_COMMUNITY): Payer: Medicare Other

## 2021-03-04 ENCOUNTER — Other Ambulatory Visit: Payer: Self-pay

## 2021-03-04 ENCOUNTER — Encounter (HOSPITAL_COMMUNITY): Payer: Self-pay | Admitting: Pharmacy Technician

## 2021-03-04 DIAGNOSIS — K5900 Constipation, unspecified: Secondary | ICD-10-CM | POA: Insufficient documentation

## 2021-03-04 DIAGNOSIS — Z5321 Procedure and treatment not carried out due to patient leaving prior to being seen by health care provider: Secondary | ICD-10-CM | POA: Insufficient documentation

## 2021-03-04 LAB — CBC WITH DIFFERENTIAL/PLATELET
Abs Immature Granulocytes: 0.04 K/uL (ref 0.00–0.07)
Basophils Absolute: 0.1 K/uL (ref 0.0–0.1)
Basophils Relative: 1 %
Eosinophils Absolute: 0.2 K/uL (ref 0.0–0.5)
Eosinophils Relative: 3 %
HCT: 47 % (ref 39.0–52.0)
Hemoglobin: 15.3 g/dL (ref 13.0–17.0)
Immature Granulocytes: 1 %
Lymphocytes Relative: 30 %
Lymphs Abs: 2.2 K/uL (ref 0.7–4.0)
MCH: 33.4 pg (ref 26.0–34.0)
MCHC: 32.6 g/dL (ref 30.0–36.0)
MCV: 102.6 fL — ABNORMAL HIGH (ref 80.0–100.0)
Monocytes Absolute: 0.7 K/uL (ref 0.1–1.0)
Monocytes Relative: 10 %
Neutro Abs: 3.9 K/uL (ref 1.7–7.7)
Neutrophils Relative %: 55 %
Platelets: 213 K/uL (ref 150–400)
RBC: 4.58 MIL/uL (ref 4.22–5.81)
RDW: 12.1 % (ref 11.5–15.5)
WBC: 7.1 K/uL (ref 4.0–10.5)
nRBC: 0 % (ref 0.0–0.2)

## 2021-03-04 LAB — BASIC METABOLIC PANEL
Anion gap: 4 — ABNORMAL LOW (ref 5–15)
BUN: 18 mg/dL (ref 8–23)
CO2: 26 mmol/L (ref 22–32)
Calcium: 9.3 mg/dL (ref 8.9–10.3)
Chloride: 101 mmol/L (ref 98–111)
Creatinine, Ser: 1.26 mg/dL — ABNORMAL HIGH (ref 0.61–1.24)
GFR, Estimated: 54 mL/min — ABNORMAL LOW (ref >60)
Glucose, Bld: 100 mg/dL — ABNORMAL HIGH (ref 70–99)
Potassium: 4.5 mmol/L (ref 3.5–5.1)
Sodium: 131 mmol/L — ABNORMAL LOW (ref 135–145)

## 2021-03-04 NOTE — ED Triage Notes (Signed)
Pt here with reports of constipation X3 days. Pt denies any abdominal pain.

## 2021-03-04 NOTE — Chronic Care Management (AMB) (Signed)
  Care Management   Note  03/04/2021 Name: Paul [name] MRN: [id number] DOB: [birth year]  Paul [name] is a 87 y.o. year old male who is a primary care patient of Simmons-Robinson, Tawanna Cooler, MD. I reached out to Claud Kelp by phone today in response to a referral sent by Paul [name] PCP,  Ronnald Ramp, MD.  Paul [name] was given information about care management services today including:  Care management services include personalized support from designated clinical staff supervised by his physician, including individualized plan of care and coordination with other care providers 24/7 contact phone numbers for assistance for urgent and routine care needs. The patient may stop care management services at any time by phone call to the office staff.  Patient agreed to services and verbal consent obtained.   Follow up plan: Telephone appointment with care management team member scheduled for:03/11/2021  [facility] Guide, Embedded Care Coordination [facility]  Care Management  Direct Dial: [phone]

## 2021-03-04 NOTE — ED Provider Notes (Signed)
Emergency Medicine Provider Triage Evaluation Note  Paul [name] , a 87 y.o. male  was evaluated in triage.  Pt complains of constipation, going on for last 3 days, still passing flatus, has no stomach pain, nausea, vomiting, has had no history of bowel obstructions, no abdominal history..  Review of Systems  Positive: Constipation, Negative: Stomach pain nausea vomiting  Physical Exam  BP (!) 161/69   Pulse 65   Temp 98.3 F (36.8 C) (Oral)   Resp 14   SpO2 97%  Gen:   Awake, no distress   Resp:  Normal effort  MSK:   Moves extremities without difficulty  Other:    Medical Decision Making  Medically screening exam initiated at 7:07 PM.  Appropriate orders placed.  Paul [name] was informed that the remainder of the evaluation will be completed by another provider, this initial triage assessment does not replace that evaluation, and the importance of remaining in the ED until their evaluation is complete.  Presents with constipation, lab work, imaging has been ordered.   Paul Sage, PA-C 03/04/21 Erick Alley, MD 03/08/21 1039

## 2021-03-05 NOTE — ED Notes (Signed)
Pt left the ED and gave lobby attendant his patient labels.

## 2021-03-11 ENCOUNTER — Telehealth: Payer: Medicare Other

## 2021-03-11 ENCOUNTER — Telehealth: Payer: Self-pay | Admitting: Licensed Clinical Social Worker

## 2021-03-11 NOTE — Chronic Care Management (AMB) (Signed)
    Clinical Social Work  Care Management   Phone Outreach    03/11/2021 Name: Paul [name] MRN: [id number] DOB: [birth year]  Paul [name] is a 87 y.o. year old male who is a primary care patient of Simmons-Robinson, Tawanna Cooler, MD .   CCM LCSW reached out to patient today by phone to introduce self, assess needs and offer Care Management services and interventions.    Telephone outreach was unsuccessful twice.  Unable to leave a HIPPA compliant phone message due to voice mail not set up.  Plan:Will route chart to Care Guide to see if patient would like to reschedule phone appointment   Review of patient status, including review of consultants reports, relevant laboratory and other test results, and collaboration with appropriate care team members and the patient's provider was performed as part of comprehensive patient evaluation and provision of care management services.    [name], LCSW Care Management & Coordination  [facility] / [facility]   [phone] 9:04 AM

## 2021-03-18 ENCOUNTER — Telehealth: Payer: Self-pay | Admitting: Licensed Clinical Social Worker

## 2021-03-18 ENCOUNTER — Telehealth: Payer: Medicare Other

## 2021-03-18 NOTE — Chronic Care Management (AMB) (Signed)
    Clinical Social Work  Care Management   Phone Outreach    03/18/2021 Name: Paul [name] MRN: [id number] DOB: [birth year]  Paul [name] is a 87 y.o. year old male who is a primary care patient of Simmons-Robinson, Tawanna Cooler, MD .   CCM LCSW reached out to patient today by phone to introduce self, assess needs and offer Care Management services and interventions.    Several calls made today to contact patient.    2nd unsuccessful telephone outreach attempt.  If unable to reach patient by phone on the 3rd attempt, will discontinue outreach calls but will be available at any time to provide services.   Plan:Will route chart to Care Guide to see if patient would like to reschedule phone appointment   Review of patient status, including review of consultants reports, relevant laboratory and other test results, and collaboration with appropriate care team members and the patient's provider was performed as part of comprehensive patient evaluation and provision of care management services.    [name], LCSW Care Management & Coordination  [facility] / [facility]   [phone] 12:08 PM

## 2021-03-19 ENCOUNTER — Telehealth: Payer: Self-pay | Admitting: *Deleted

## 2021-03-19 NOTE — Chronic Care Management (AMB) (Signed)
  Care Management   Note  03/19/2021 Name: Paul [name] MRN: [id number] DOB: Aug 24, 1926  Paul [name] is a 87 y.o. year old male who is a primary care patient of Simmons-Robinson, Tawanna Cooler, MD and is actively engaged with the care management team. I reached out to Claud Kelp by phone today to assist with re-scheduling an initial visit with the Licensed Clinical Social Worker  Follow up plan: Unsuccessful telephone outreach attempt made. A HIPAA compliant phone message was left for the patient providing contact information and requesting a return call.  The care management team will reach out to the patient again over the next 7-14 days.  If patient returns call to provider office, please advise to call Embedded Care Management Care Guide Misty Stanley at [phone].  Gwenevere Ghazi  Care Guide, Embedded Care Coordination [facility] Management  Direct Dial: [phone]

## 2021-03-30 NOTE — Chronic Care Management (AMB) (Signed)
  Care Management   Note  03/30/2021 Name: Kollyn Lingafelter MRN: [id number] DOB: [birth year]  [name] is a 87 y.o. year old male who is a primary care patient of Simmons-Robinson, Tawanna Cooler, MD and is actively engaged with the care management team. I reached out to Claud Kelp by phone today to assist with re-scheduling an initial visit with the Licensed Clinical Social Worker  Follow up plan: Telephone appointment with care management team member scheduled for:04/01/21  Gwenevere Ghazi  Care Guide, Embedded Care Coordination [facility]  Care Management  Direct Dial: [phone]

## 2021-04-01 ENCOUNTER — Ambulatory Visit: Payer: Medicare Other | Admitting: Licensed Clinical Social Worker

## 2021-04-01 DIAGNOSIS — Z7189 Other specified counseling: Secondary | ICD-10-CM

## 2021-04-01 NOTE — Chronic Care Management (AMB) (Addendum)
Care Management Clinical Social Work Note  04/01/2021 Name: Paul [name] MRN: [id number] DOB: 1927-07-25  Paul [name] is a 87 y.o. year old male who is a primary care patient of Simmons-Robinson, Tawanna Cooler, MD.  The Care Management team was consulted for assistance with coordination needs.  Engaged with patient by telephone for initial visit in response to provider referral for social work chronic care management and care coordination services  Consent to Services:  Paul [name] was given information about Care Management services today including:  Care Management services includes personalized support from designated clinical staff supervised by his physician, including individualized plan of care and coordination with other care providers 24/7 contact phone numbers for assistance for urgent and routine care needs. The patient may stop case management services at any time by phone call to the office staff.  Patient agreed to services and consent obtained.   Consent to Services:  The patient was given information about Care Management services, agreed to services, and gave verbal consent prior to initiation of services.  Please see initial visit note for detailed documentation.   Patient agreed to services today and consent obtained.   Assessment: .   Patient is lonely since wife passed but not willing to connect for community support or counseling , He rides moped to pick up a cooked meal each day at the [facility]community agency) , "I'm not much for cooking". See Care Plan below for interventions and patient self-care actives. Recent life changes or stressors: wife passed several months ago.  Recommendation: Patient may benefit from, and is in agreement to allow LCSW to assist with connecting to enhanced benefits and make referral to meals on wheels.   Follow up Plan: Patient would like continued follow-up from CCM LCSW .  per patient's request will follow up in 1 week 04/08/21.  Will call  office if needed prior to next encounter.   Review of patient past medical history, allergies, medications, and health status, including review of relevant consultants reports was performed today as part of a comprehensive evaluation and provision of chronic care management and care coordination services.  SDOH (Social Determinants of Health) assessments and interventions performed:  SDOH Interventions    Flowsheet Row Most Recent Value  SDOH Interventions   Stress Interventions Patient Refused  Social Connections Interventions Patient Refused        Advanced Directives Status: Not addressed in this encounter.  Care Plan  No Known Allergies  Outpatient Encounter Medications as of 04/01/2021  Medication Sig   amLODipine (NORVASC) 5 MG tablet Take 1 tablet (5 mg total) by mouth daily.   cetirizine (ZYRTEC) 5 MG tablet Take 1 tablet (5 mg total) by mouth daily.   fluticasone (FLONASE) 50 MCG/ACT nasal spray SHAKE LIQUID AND USE 2 SPRAYS IN EACH NOSTRIL DAILY   polyethylene glycol powder (GLYCOLAX/MIRALAX) powder Take 1 teaspoon daily to have one soft bowel movement daily. You can increase amount daily as needed.   No facility-administered encounter medications on file as of 04/01/2021.    Patient Active Problem List   Diagnosis Date Noted   Grief reaction 07/19/2019   Seasonal allergic rhinitis 10/02/2018   Impaired memory 10/02/2018   Headache 06/20/2018   Shortness of breath 05/28/2013   Erectile dysfunction 02/14/2012   PAGET'S DISEASE 08/17/2010   BENIGN PROSTATIC HYPERTROPHY, WITH URINARY OBSTRUCTION 03/09/2009   URINARY INCONTINENCE 03/09/2009   HYPERLIPIDEMIA 11/28/2008   INSOMNIA 11/28/2008   Essential hypertension 11/13/2008   GASTROESOPHAGEAL REFLUX DISEASE, MILD 11/13/2008  CONSTIPATION, CHRONIC 11/13/2008    Conditions to be addressed/monitored: ; Limited social support  Care Plan : General Social Work (Adult)  Updates made by Soundra Pilon, LCSW since  04/01/2021 12:00 AM     Problem: Need Community support      Goal: Barriers to Treatment Identified and Managed   Start Date: 04/01/2021  This Visit's Progress: On track  Priority: High  Note:   Current barriers:    Financial constraints related to fixed income, Limited social support, Limited access to food, Literacy concerns, and Limited education about enhanced benefits Clinical Goals: Patient will work with LCSW to address unmet needs  Clinical Interventions:  Inter-disciplinary care team collaboration (see longitudinal plan of care) Assessment of needs, barriers , agencies contacted, as well as how impacting Review various resources, discussed options and provided patient information about  Transportation provided by insurance provider, [employer] options , Enhanced Benefits connected with insurance provider , Meals on wheels, and Services provided by [employer], Active listening / Reflection utilized , Emotional Supportive Provided, Problem Solving [employer] , Participation in counseling encouraged , Suicidal Ideation/Homicidal Ideation assessed:, Collaborated with [facility] Healthy Benefits plus [phone] with patient on the line to connect with benefits card, assisted with changing address , and Made referral to meals on wheels via [employer]. Patient Goals/Self-Care Activities: Over the next 30 days I have placed a referral for meals on wheels. I will call next week to review enhanced benefits with you You will receive your benefits card per [facility] in 2 to 3 weeks Call Healthy Benefits if you have questions [phone]  you will have $220 per month on your card     [name], LCSW Care Management & Coordination  [facility] / [facility]   [phone] 12:15 PM

## 2021-04-01 NOTE — Patient Instructions (Signed)
Licensed Clinical Social Worker Visit Information  Goals we discussed today:   Goals Addressed             This Visit's Progress    Find Help in My Community       Timeframe:  Short-Term Goal Priority:  High Start Date:   04/01/2021                          Expected End Date:                       Follow Up Date 04/08/21     Patient Goals/Self-Care Activities: Over the next 30 days I have placed a referral for meals on wheels. I will call next week to get review enhanced benefits You will receive your benefits card per [facility] in 2 to 3 weeks Call Healthy Benefits if you have questions [phone]  you will have $220 per month on your card   Why is this important?   Knowing how and where to find help for yourself or family in your neighborhood and community is an important skill.          Paul [name] was given information about Care Management services today including:  Care Management services include personalized support from designated clinical staff supervised by his physician, including individualized plan of care and coordination with other care providers 24/7 contact phone numbers for assistance for urgent and routine care needs. The patient may stop Care Management services at any time by phone call to the office staff.   Patient agreed to services and verbal consent obtained.   The patient verbalized understanding of instructions, educational materials, and care plan provided today and declined offer to receive copy of patient instructions, educational materials, and care plan.   Follow up plan: Appointment scheduled for SW follow up with client by phone on: 04/08/21   Paul Hines, LCSW Care Management & Coordination   [phone]

## 2021-04-08 ENCOUNTER — Ambulatory Visit: Payer: Medicare Other | Admitting: Licensed Clinical Social Worker

## 2021-04-08 DIAGNOSIS — Z7189 Other specified counseling: Secondary | ICD-10-CM

## 2021-04-08 NOTE — Chronic Care Management (AMB) (Signed)
Care Management Clinical Social Work Note  04/08/2021 Name: Paul [name] MRN: [id number] DOB: [birth year]  Paul [name] is a 87 y.o. year old male who is a primary care patient of Simmons-Robinson, Tawanna Cooler, MD.  The Care Management team was consulted for assistance with coordination needs.   Consent to Services:  The patient was given information about Care Management services, agreed to services, and gave verbal consent prior to initiation of services.  Please see initial visit note for detailed documentation.   Patient agreed to services today and consent obtained.   Assessment: Engaged with patient by phone in response to provider referral for social work care coordination services:  He is making progress with connected to the enhanced benefits from his insurance provider. This provides him an additional $220.00 per month for food and OTC items . See Care Plan below for interventions and patient self-care actives.  Follow up Plan: Patient would like continued follow-up from CCM LCSW .  per patient's request will follow up in 2 weeks.  Will call office if needed prior to next encounter.   Review of patient past medical history, allergies, medications, and health status, including review of relevant consultants reports was performed today as part of a comprehensive evaluation and provision of chronic care management and care coordination services.  SDOH (Social Determinants of Health) assessments and interventions performed:    Advanced Directives Status: Not addressed in this encounter.  Care Plan  No Known Allergies  Outpatient Encounter Medications as of 04/08/2021  Medication Sig   amLODipine (NORVASC) 5 MG tablet Take 1 tablet (5 mg total) by mouth daily.   cetirizine (ZYRTEC) 5 MG tablet Take 1 tablet (5 mg total) by mouth daily.   fluticasone (FLONASE) 50 MCG/ACT nasal spray SHAKE LIQUID AND USE 2 SPRAYS IN EACH NOSTRIL DAILY   polyethylene glycol powder (GLYCOLAX/MIRALAX) powder  Take 1 teaspoon daily to have one soft bowel movement daily. You can increase amount daily as needed.   No facility-administered encounter medications on file as of 04/08/2021.    Patient Active Problem List   Diagnosis Date Noted   Grief reaction 07/19/2019   Seasonal allergic rhinitis 10/02/2018   Impaired memory 10/02/2018   Headache 06/20/2018   Shortness of breath 05/28/2013   Erectile dysfunction 02/14/2012   PAGET'S DISEASE 08/17/2010   BENIGN PROSTATIC HYPERTROPHY, WITH URINARY OBSTRUCTION 03/09/2009   URINARY INCONTINENCE 03/09/2009   HYPERLIPIDEMIA 11/28/2008   INSOMNIA 11/28/2008   Essential hypertension 11/13/2008   GASTROESOPHAGEAL REFLUX DISEASE, MILD 11/13/2008   CONSTIPATION, CHRONIC 11/13/2008    Conditions to be addressed/monitored:  Limited access to food, Literacy concerns, and Lacks knowledge of community resource:    Care Plan : General Social Work (Adult)  Updates made by Paul Pilon, LCSW since 04/08/2021 12:00 AM     Problem: Need Community support      Goal: Barriers to Treatment Identified and Managed   Start Date: 04/01/2021  This Visit's Progress: On track  Recent Progress: On track  Priority: High  Note:   Current barriers:    Financial constraints related to fixed income,  Limited social support, Limited access to food,  Literacy concerns, and Limited education about enhanced benefits Clinical Goals: Patient will work with LCSW to address unmet needs  Clinical Interventions:  Inter-disciplinary care team collaboration (see longitudinal plan of care) Assessment of needs, barriers , agencies contacted, as well as how impacting Review various resources, discussed options and provided patient information about Transportation provided by insurance provider, [employer]  options , Enhanced Benefits connected with insurance provider , Meals on wheels, and Services provided by [employer], Active listening /  Reflection utilized , Emotional Supportive Provided, Problem Solving [name] , Participation in counseling encouraged , Suicidal Ideation/Homicidal Ideation assessed:, Collaborated with [facility] Healthy Benefits plus [phone] with patient on the line to connect with benefits card, assisted with changing address , and Made referral to meals on wheels via [employer]. ( Meals on wheels referral excepted per [facility] referral placed) Collaborated with patient insurance provided to get enhanced benefits ordered for patient;on phone with patient to assist with selecting items; order placed anticipated delivery Aug. 29th ; He will received new card by Sept 1st with new allocation for $220. Patient Goals/Self-Care Activities: Over the next 30 days I have placed a referral for meals on wheels. You will receive your benefits card per [facility] in 2 to 3 weeks Call Healthy Benefits if you have questions call [phone]   you will have $220 per month on your card you can go to the store or call to place an order [phone]      [name], LCSW Care Management & Coordination  [facility] / [facility]   [phone] 11:40 AM

## 2021-04-08 NOTE — Patient Instructions (Signed)
Visit Information   Goals Addressed             This Visit's Progress    Find Help in My Community   On track    Timeframe:  Short-Term Goal Priority:  High Start Date:   04/01/2021                          Expected End Date:                       Follow Up Date 04/08/21     Patient Goals/Self-Care Activities: Over the next 30 days I have placed a referral for meals on wheels. Your online order for items has been placed they will arrive by Aug 29th  You will receive your benefits card per [facility] in 2 to 3 weeks Call Healthy Benefits if you have questions call [phone]   you will have $220 per month on your card you can go to the store or call to place an order [phone]   Why is this important?   Knowing how and where to find help for yourself or family in your neighborhood and community is an important skill.         It was a pleasure speaking with you today. Please call the office if needed No follow up scheduled, per our conversation I will contact you in 2 weeks.  Patient verbalizes understanding of instructions provided today.   [name], LCSW Care Management & Coordination  [phone]

## 2021-04-20 ENCOUNTER — Ambulatory Visit: Payer: Medicare Other | Admitting: Licensed Clinical Social Worker

## 2021-04-20 DIAGNOSIS — Z789 Other specified health status: Secondary | ICD-10-CM

## 2021-04-20 DIAGNOSIS — Z139 Encounter for screening, unspecified: Secondary | ICD-10-CM

## 2021-04-20 NOTE — Chronic Care Management (AMB) (Signed)
Care Management Clinical Social Work Note  04/20/2021 Name: Paul [name] MRN: [id number] DOB: 1926-12-01  Paul [name] is a 87 y.o. year old male who is a primary care patient of Simmons-Robinson, Tawanna Cooler, MD.  The Care Management team was consulted for assistance with coordination needs.Community Resources   Consent to Services:  The patient was given information about Care Management services, agreed to services, and gave verbal consent prior to initiation of services.  Please see initial visit note for detailed documentation.   Patient agreed to services today and consent obtained.   Assessment: Engaged with patient  by phone in response to provider referral for social work care coordination services: .   Patient received all items in the mail from his enhanced benefits, however still has not received his benefits card. Patient will continue to look for card in the mail.  Care Plan below for interventions and patient self-care actives.  Follow up Plan: Patient would like continued follow-up from CCM LCSW .  per patient's request will follow up in 2 to 3 weeks.  Will call office if needed prior to next encounter.   Review of patient past medical history, allergies, medications, and health status, including review of relevant consultants reports was performed today as part of a comprehensive evaluation and provision of chronic care management and care coordination services.  SDOH (Social Determinants of Health) assessments and interventions performed:    Advanced Directives Status: Not addressed in this encounter.  Care Plan  No Known Allergies  Outpatient Encounter Medications as of 04/20/2021  Medication Sig   amLODipine (NORVASC) 5 MG tablet Take 1 tablet (5 mg total) by mouth daily.   cetirizine (ZYRTEC) 5 MG tablet Take 1 tablet (5 mg total) by mouth daily.   fluticasone (FLONASE) 50 MCG/ACT nasal spray SHAKE LIQUID AND USE 2 SPRAYS IN EACH NOSTRIL DAILY   polyethylene glycol powder  (GLYCOLAX/MIRALAX) powder Take 1 teaspoon daily to have one soft bowel movement daily. You can increase amount daily as needed.   No facility-administered encounter medications on file as of 04/20/2021.    Patient Active Problem List   Diagnosis Date Noted   Grief reaction 07/19/2019   Seasonal allergic rhinitis 10/02/2018   Impaired memory 10/02/2018   Headache 06/20/2018   Shortness of breath 05/28/2013   Erectile dysfunction 02/14/2012   PAGET'S DISEASE 08/17/2010   BENIGN PROSTATIC HYPERTROPHY, WITH URINARY OBSTRUCTION 03/09/2009   URINARY INCONTINENCE 03/09/2009   HYPERLIPIDEMIA 11/28/2008   INSOMNIA 11/28/2008   Essential hypertension 11/13/2008   GASTROESOPHAGEAL REFLUX DISEASE, MILD 11/13/2008   CONSTIPATION, CHRONIC 11/13/2008    Conditions to be addressed/monitored: ; Social Isolation  Care Plan : General Social Work (Adult)  Updates made by Soundra Pilon, LCSW since 04/20/2021 12:00 AM     Problem: Need Community support      Goal: Barriers to Treatment Identified and Managed   Start Date: 04/01/2021  This Visit's Progress: On track  Recent Progress: On track  Priority: High  Note:   Current barriers:    Financial constraints related to fixed income,  Limited social support, Limited access to food,  Literacy concerns, and Limited education about enhanced benefits Clinical Goals: Patient will work with LCSW to address unmet needs  Clinical Interventions:  Inter-disciplinary care team collaboration (see longitudinal plan of care) Assessment of needs, barriers , agencies contacted, as well as how impacting Review various resources, discussed options and provided patient information about Transportation provided by insurance provider, [employer] options , Enhanced Benefits connected  with insurance provider , Meals on wheels, and Services provided by [employer], Active listening / Reflection utilized , Emotional Supportive  Provided, Problem Solving /Task Center , Collaborated with [facility] Healthy Benefits plus [phone] with patient on the line to connect with benefits card, assisted with changing address , and Made referral to meals on wheels via [employer]. ( Meals on wheels referral excepted per [facility] referral placed) He has not received new card for  Sept allocation for $220. Received all items in the mail from Aug order Patient Goals/Self-Care Activities: Over the next 30 days I have placed a referral for meals on wheels. You will receive your benefits card per [facility] in 2 to 3 weeks Call Healthy Benefits if you have questions call [phone]   you will have $220 per month on your card you can go to the store or call to place an order [phone]      [name], LCSW Care Management & Coordination  [facility] / [facility]   [phone] 4:22 PM

## 2021-04-20 NOTE — Patient Instructions (Signed)
Visit Information   Goals Addressed             This Visit's Progress    Find Help in My Community   On track    Timeframe:  Short-Term Goal Priority:  High Start Date:   04/01/2021                          Expected End Date:                       Follow Up Date 04/08/21     Patient Goals/Self-Care Activities: Over the next 30 days I have placed a referral for meals on wheels. Your online order for items has been placed they will arrive by Aug 29th  You will receive your benefits card per [facility] in 2 to 3 weeks Call Healthy Benefits if you have questions call [phone]   you will have $220 per month on your card you can go to the store or call to place an order [phone]   Why is this important?   Knowing how and where to find help for yourself or family in your neighborhood and community is an important skill.       It was a pleasure speaking with you today. Please call the office if needed No follow up scheduled, per our conversation I will contact you in 2 to 3 weeks.  Patient verbalizes understanding of instructions provided today.   [name], LCSW Care Management & Coordination  [phone]

## 2021-04-22 ENCOUNTER — Emergency Department (HOSPITAL_COMMUNITY)
Admission: EM | Admit: 2021-04-22 | Discharge: 2021-04-22 | Discharge status: Against Medical Advice | Payer: Medicare Other

## 2021-04-22 NOTE — ED Notes (Signed)
Called for triage sevreal times and unable to locate in lobby

## 2021-04-22 NOTE — ED Notes (Signed)
Pt called again and unable to find. RN aware moving pts name OTF

## 2021-05-05 ENCOUNTER — Ambulatory Visit: Payer: Medicare Other | Admitting: Licensed Clinical Social Worker

## 2021-05-05 DIAGNOSIS — Z7189 Other specified counseling: Secondary | ICD-10-CM

## 2021-05-05 DIAGNOSIS — Z789 Other specified health status: Secondary | ICD-10-CM

## 2021-05-05 NOTE — Patient Instructions (Signed)
Visit Information   Goals Addressed             This Visit's Progress    Find Help in My Community       Timeframe:  Short-Term Goal Priority:  High Start Date:   04/01/2021                          Expected End Date:                       Follow Up Date 04/08/21     Patient Goals/Self-Care Activities: Over the next 30 days I have placed a referral for meals on wheels. Your online order for items has been placed they will arrive by Sept 26th  You will receive your benefits card per [facility] in 2 to 3 weeks Call Healthy Benefits if you have questions call [phone]   you will have $220 per month on your card you can go to the store or call to place an order [phone]   Why is this important?   Knowing how and where to find help for yourself or family in your neighborhood and community is an important skill.

## 2021-05-05 NOTE — Chronic Care Management (AMB) (Signed)
Care Management Clinical Social Work Note  05/05/2021 Name: Paul [name] MRN: [id number] DOB: [birth year]  Paul [name] is a 87 y.o. year old male who is a primary care patient of Simmons-Robinson, Tawanna Cooler, MD.  The Care Management team was consulted for assistance with coordination needs. Chief Financial Officer to Services:  The patient was given information about Care Management services, agreed to services, and gave verbal consent prior to initiation of services.  Please see initial visit note for detailed documentation.   Patient agreed to services today and consent obtained.   Assessment: Engaged with patient by phone in response to provider referral for social work care coordination services: .   He continues to experience difficulty with getting Healthy Benefits card. Assisted with barriers during this encounter ( address change and on-line order).. See Care Plan below for interventions and patient self-care actives.  Follow up Plan: Patient would like continued follow-up from CCM LCSW .  per patient's request will follow up in 30 days to see if he has received card.  Will call office if needed prior to next encounter.    Review of patient past medical history, allergies, medications, and health status, including review of relevant consultants reports was performed today as part of a comprehensive evaluation and provision of chronic care management and care coordination services.  SDOH (Social Determinants of Health) assessments and interventions performed:    Advanced Directives Status: Not ready or willing to discuss.  Care Plan  No Known Allergies  Outpatient Encounter Medications as of 05/05/2021  Medication Sig   amLODipine (NORVASC) 5 MG tablet Take 1 tablet (5 mg total) by mouth daily.   cetirizine (ZYRTEC) 5 MG tablet Take 1 tablet (5 mg total) by mouth daily.   fluticasone (FLONASE) 50 MCG/ACT nasal spray SHAKE LIQUID AND USE 2 SPRAYS IN EACH  NOSTRIL DAILY   polyethylene glycol powder (GLYCOLAX/MIRALAX) powder Take 1 teaspoon daily to have one soft bowel movement daily. You can increase amount daily as needed.   No facility-administered encounter medications on file as of 05/05/2021.    Patient Active Problem List   Diagnosis Date Noted   Grief reaction 07/19/2019   Seasonal allergic rhinitis 10/02/2018   Impaired memory 10/02/2018   Headache 06/20/2018   Shortness of breath 05/28/2013   Erectile dysfunction 02/14/2012   PAGET'S DISEASE 08/17/2010   BENIGN PROSTATIC HYPERTROPHY, WITH URINARY OBSTRUCTION 03/09/2009   URINARY INCONTINENCE 03/09/2009   HYPERLIPIDEMIA 11/28/2008   INSOMNIA 11/28/2008   Essential hypertension 11/13/2008   GASTROESOPHAGEAL REFLUX DISEASE, MILD 11/13/2008   CONSTIPATION, CHRONIC 11/13/2008    Conditions to be addressed/monitored: ; Level of care concerns  Care Plan : General Social Work (Adult)  Updates made by Soundra Pilon, LCSW since 05/05/2021 12:00 AM     Problem: Need Community support      Goal: Barriers to Treatment Identified and Managed   Start Date: 04/01/2021  This Visit's Progress: On track  Recent Progress: On track  Priority: High  Note:   Current barriers:   Patient continues to have difficulty and has not received his benefits cards Financial constraints related to fixed income,  Limited social support, Limited access to food,  Literacy concerns, and Limited education about enhanced benefits Clinical Goals: Patient will work with LCSW to address unmet needs  Clinical Interventions:  Inter-disciplinary care team collaboration (see longitudinal plan of care) Assessment of needs, barriers , agencies contacted, as well as how impacting Review various resources, discussed options and  provided patient information about Transportation provided by insurance provider, [employer] options , Enhanced Benefits connected with insurance provider , Meals on wheels, and  Services provided by [employer], Active listening / Reflection utilized , Emotional Supportive Provided, Problem Solving /Task Center , Collaborated with [facility] Healthy Benefits plus [phone] with patient on the line to connect with benefits card, assisted with changing address , and Made referral to meals on wheels via [place] [facility]. ( Meals on wheels referral excepted per [facility] referral placed) On the line with [facility] Healthy Benefits to assist with resolving issues with patient's card and benefits.  Assisted with placing new order for Sept  New card order again  will received in Oct ; allocation for $220. He has not received new card for  Sept  Received all items in the mail from Aug order Patient Goals/Self-Care Activities: Over the next 30 days I have placed a referral for meals on wheels. You will receive your benefits card per [facility] in 2 to 3 weeks Call Healthy Benefits if you have questions call [phone]   you will have $220 per month on your card you can go to the store or call to place an order [phone]     Paul Hines, LCSW Care Management & Coordination  [facility] Family [facility] / [facility]   [phone] 12:05 PM

## 2021-05-31 ENCOUNTER — Ambulatory Visit: Payer: Medicare Other | Admitting: Licensed Clinical Social Worker

## 2021-05-31 DIAGNOSIS — Z7189 Other specified counseling: Secondary | ICD-10-CM

## 2021-05-31 NOTE — Patient Instructions (Signed)
Visit Information   Goals Addressed             This Visit's Progress    Find Help in My Community       Timeframe:  Short-Term Goal Priority:  High Start Date:   04/01/2021                          Expected End Date:                       Follow Up Date 04/08/21     Patient Goals/Self-Care Activities: Over the next 30 days I have placed a referral for meals on wheels. Your online order for items has been placed they will arrive by Sept 26th  You will receive your benefits card per [facility] in 2 to 3 weeks Call Healthy Benefits if you have questions call [phone]  you will have $220 per month on your card you can go to the store or call to place an order [phone] or [phone]  Why is this important?   Knowing how and where to find help for yourself or family in your neighborhood and community is an important skill.         It was a pleasure speaking with you today. Please call the office if needed No follow up scheduled, per our conversation I will contact you in 3 to 4 weeks.  Patient verbalizes understanding of instructions provided today.   [name], LCSW Care Management & Coordination  [phone]

## 2021-05-31 NOTE — Chronic Care Management (AMB) (Signed)
Care Management  Clinical Social Work Note  05/31/2021 Name: Paul [name] MRN: [id number] DOB: [birth year]  Paul [name] is a 87 y.o. year old male who is a primary care patient of Simmons-Robinson, Tawanna Cooler, MD. The CCM team was consulted for assistance with care coordination needs. Systems analyst to Services:  The patient was given information about Care Management services, agreed to services, and gave verbal consent prior to initiation of services.  Please see initial visit note for detailed documentation.   Patient agreed to services today and consent obtained.  Engaged with patient by telephone for follow up visit in response to provider referral for social work care coordination services.   Assessment/Interventions: Assessed patient's current treatment, progress, coping skills, support system and barriers to care.  He is making progress with and continues to receive the monthly delivers , but continues to have difficulty with receiving his health benefits card, this is the 3rd month we have requested a new card .  He continues to be on the wait list for meals on wheels. See Care Plan below for interventions and patient self-care actives.  Follow up Plan: Patient would like continued follow-up from CCM LCSW .  per patient's request will follow up in 3 to 4 weeks.  Will call office if needed prior to next encounter.   Review of patient past medical history, allergies, medications, and health status, including review of pertinent consultant reports was performed as part of comprehensive evaluation and provision of care management/care coordination services.   SDOH (Social Determinants of Health) screening and interventions performed today:   Advanced Directives Status:Not ready or willing to discuss     Care Plan    Conditions to be addressed/monitored per PCP order: , Financial constraints and Limited access to food  Care Plan : General Social Work  (Adult)  Updates made by Soundra Pilon, LCSW since 05/31/2021 12:00 AM     Problem: Need Community support      Goal: Barriers to Treatment Identified and Managed   Start Date: 04/01/2021  This Visit's Progress: On track  Recent Progress: On track  Priority: High  Note:   Current barriers:   Patient continues to have difficulty and has not received his benefits cards Financial constraints related to fixed income,  Limited social support, Limited access to food,  Literacy concerns, and Limited education about enhanced benefits Clinical Goals: Patient will work with LCSW to address unmet needs  Clinical Interventions:  Inter-disciplinary care team collaboration (see longitudinal plan of care) Assessment of needs, barriers , agencies contacted, as well as how impacting Review various resources, discussed options and provided patient information about Transportation provided by insurance provider, [employer] options , Enhanced Benefits connected with insurance provider , Meals on wheels, and Services provided by [employer], Active listening / Reflection utilized , Emotional Supportive Provided, Problem Solving /Task Center , Collaborated with [facility] Healthy Benefits plus [phone] with patient on the line to connect with benefits card, assisted with changing address , and Made referral to meals on wheels via [place] [facility]. ( Meals on wheels referral excepted per [facility] referral placed) Message sent to [facility] with [employer] to follow up n the meals on wheels application  Anticipate 1 to 2 more months before start of meals on wheels for patient On the line with [facility] Healthy Benefits again this month to assist with resolving issues with patient's card and benefits.  Assisted with placing new order  for Oct:  New card order again 05/31/21 ; allocation for $220. He has not received new card for  Sept , or Oct Received all items in the mail from Aug and  Sept's order Patient Goals/Self-Care Activities: Over the next 30 days I have placed a referral for meals on wheels. You will receive your benefits card per [facility] in 2 to 3 weeks Call Healthy Benefits if you have questions call [phone]  you will have $220 per month on your card you can go to the store or call to place an order [phone] or [phone]     [name], LCSW Care Management & Coordination  [facility] Family [facility] / [facility] [employer]   [phone]

## 2021-06-01 ENCOUNTER — Ambulatory Visit: Payer: Medicare Other | Admitting: Licensed Clinical Social Worker

## 2021-06-01 DIAGNOSIS — Z789 Other specified health status: Secondary | ICD-10-CM

## 2021-06-01 NOTE — Chronic Care Management (AMB) (Signed)
Care Management  Clinical Social Work Note  06/01/2021 Name: Paul [name] MRN: [id number] DOB: 06-04-1927  Paul [name] is a 87 y.o. year old male who is a primary care patient of Simmons-Robinson, Tawanna Cooler, MD. The CCM team was consulted for assistance with care coordination needs. Systems analyst to Services:  The patient was given information about Care Management services, agreed to services, and gave verbal consent prior to initiation of services.  Please see initial visit note for detailed documentation.   Patient agreed to services today and consent obtained.  Engaged with patient by telephone for follow up visit in response to provider referral for social work care coordination services.   Assessment/Interventions: Assessed patient's current treatment, progress, coping skills, support system and barriers to care.  Patient reports he received deliveries today from [facility] healthy benefits program; assessed needs for food: has food and microwave to heat up meals:   . See Care Plan below for interventions and patient self-care actives.  Recent life changes or stressors: reports short money from teller when he went pick up rent:  Solution focused interventions discussed with patient to resolve issue and for ongoing needs.  Recommendation: Patient may benefit from, and is in agreement to ask for money order to pay rent going forward.   Follow up Plan: Patient would like continued follow-up from CCM LCSW .  per patient's request will follow up in 3 weeks.  Will call office if needed prior to next encounter.   Review of patient past medical history, allergies, medications, and health status, including review of pertinent consultant reports was performed as part of comprehensive evaluation and provision of care management/care coordination services.   [name], LCSW Care Management & Coordination  [facility] Family Medicine / [facility] [employer]    [phone]

## 2021-06-01 NOTE — Patient Instructions (Signed)
Visit Information   Goals Addressed             This Visit's Progress    Find Help in My Community   On track    Timeframe:  Short-Term Goal Priority:  High Start Date:   04/01/2021                          Expected End Date:                       Follow Up Date 04/08/21     Patient Goals/Self-Care Activities: Over the next 30 days I have placed a referral for meals on wheels. Your online order for items has been placed they will arrive by Sept 26th  You will receive your benefits card per [facility] in 2 to 3 weeks Call Healthy Benefits if you have questions call [phone]  you will have $220 per month on your card you can go to the store or call to place an order [phone] or [phone]  Why is this important?   Knowing how and where to find help for yourself or family in your neighborhood and community is an important skill.         It was a pleasure speaking with you today. Please call the office if needed No follow up scheduled, per our conversation I will contact you in 3 weeks.  Patient verbalizes understanding of instructions provided today.   [name], LCSW Care Management & Coordination  [phone]

## 2021-06-03 ENCOUNTER — Encounter: Payer: Self-pay | Admitting: Family Medicine

## 2021-06-03 ENCOUNTER — Ambulatory Visit (INDEPENDENT_AMBULATORY_CARE_PROVIDER_SITE_OTHER): Payer: Medicare Other

## 2021-06-03 ENCOUNTER — Ambulatory Visit (INDEPENDENT_AMBULATORY_CARE_PROVIDER_SITE_OTHER): Payer: Medicare Other | Admitting: Family Medicine

## 2021-06-03 ENCOUNTER — Other Ambulatory Visit: Payer: Self-pay

## 2021-06-03 VITALS — BP 175/88 | HR 66 | Ht 65.0 in | Wt 173.2 lb

## 2021-06-03 DIAGNOSIS — M5415 Radiculopathy, thoracolumbar region: Secondary | ICD-10-CM | POA: Diagnosis not present

## 2021-06-03 DIAGNOSIS — Z23 Encounter for immunization: Secondary | ICD-10-CM

## 2021-06-03 NOTE — Patient Instructions (Signed)
Please go to [address], [employer], for [employer] of your back. I will follow up with results once they are available.

## 2021-06-08 DIAGNOSIS — M5410 Radiculopathy, site unspecified: Secondary | ICD-10-CM

## 2021-06-08 HISTORY — DX: Radiculopathy, site unspecified: M54.10

## 2021-06-08 NOTE — Assessment & Plan Note (Signed)
Brought range of radicular symptoms from the left thoracic area to the left foot.  No weakness.  We will obtain thoracic and lumbar x-rays as well as chest x-ray given history of pulmonary findings in the past per the patient.

## 2021-06-08 NOTE — Progress Notes (Signed)
    SUBJECTIVE:   CHIEF COMPLAINT / HPI: Back pain  Patient reports that he has been having left-sided thoracic pain for the last few weeks that has been progressively worsening.  Pain states that he has normal range of motion of his back but states that he is concerned about the pain as he was told that he had a lung finding on previous imaging that he is concerned could be causing worsening of his problems.  He states that he often has left-sided pain that radiates from his left thoracic region all the way down to his left foot.  PERTINENT  PMH / PSH:  Hypertension Memory loss Paget disease  OBJECTIVE:   BP (!) 175/88   Pulse 66   Ht 5\' 5"  (1.651 m)   Wt 173 lb 3.2 oz (78.6 kg)   SpO2 99%   BMI 28.82 kg/m   Back: No gross deformity, scoliosis. TTP in left thoracic region.  No midline or bony TTP. FROM. Strength LEs 5/5 all muscle groups.   Negative SLRs. Sensation intact to light touch bilaterally.  Cardio: RRR  Pulm: normal WOB on RA, no wheezing/crackles    ASSESSMENT/PLAN:   Radiculopathy Brought range of radicular symptoms from the left thoracic area to the left foot.  No weakness.  We will obtain thoracic and lumbar x-rays as well as chest x-ray given history of pulmonary findings in the past per the patient.    , MD [facility]

## 2021-07-05 ENCOUNTER — Ambulatory Visit: Payer: Medicare Other | Admitting: Licensed Clinical Social Worker

## 2021-07-05 NOTE — Patient Instructions (Signed)
Licensed Clinical Social Worker Visit Information  Goals we discussed today:   Goals Addressed             This Visit's Progress    Find Help in My Community   On track    Timeframe:  Short-Term Goal Priority:  High Start Date:   04/01/2021                          Expected End Date:                          Patient Goals/Self-Care Activities: I have placed a referral for meals on wheels. Call Healthy Benefits if you have questions call [phone]  you will have $220 per month on your card you can go to the store or call to place an order [phone] or [phone]        It was a pleasure speaking with you today. Please call the office if needed No follow up scheduled, per our conversation I will contact you in 2 to 3 weeks.  Please call the office if needed prior to next encounter. Patient verbalized understanding of instructions, educational materials, and care plan provided today and declined offer to receive copy of patient instructions, educational materials, and care plan.  [name], LCSW Care Management & Coordination  [phone]

## 2021-07-05 NOTE — Chronic Care Management (AMB) (Signed)
Care Management  Clinical Social Work Note  07/05/2021 Name: Paul [name] MRN: [id number] DOB: 1926/11/14  Paul [name] is a 87 y.o. year old male who is a primary care patient of Simmons-Robinson, Tawanna Cooler, MD. The CCM team was consulted for assistance with care coordination needs. Community Resources    Consent to Services:  The patient was given information about Care Management services, agreed to services, and gave verbal consent prior to initiation of services.  Please see initial visit note for detailed documentation.   Patient agreed to services today and consent obtained.  Engaged with patient by telephone for follow up visit in response to provider referral for social work care coordination services.   Assessment/Interventions: Assessed patient's current treatment, progress, coping skills, support system and barriers to care.  Collaborated with [employer] in order to meet patient's ongoing needs. Patient is making progress with and has received his healthy food care, however he has not been able to activate the card.  Still waiting to hear from [employer] .  See Care Plan below for interventions and patient self-care actives.  Recommendation: Patient may benefit from, and is in agreement to attempt to activate card again.   Follow up Plan: No follow up scheduled with CCM LCSW at this time. Will follow up with patient in 2 weeks .Patient will call office if needed prior to next encounter. CCM LCSW will continue to collaborate with meals on wheels in order to meet patient's needs .   Review of patient past medical history, allergies, medications, and health status, including review of pertinent consultant reports was performed as part of comprehensive evaluation and provision of care management/care coordination services.   SDOH (Social Determinants of Health) screening and interventions performed today:  SDOH Interventions    Flowsheet Row Most Recent Value  SDOH  Interventions   SDOH Interventions for the Following Domains Food Insecurity  Food Insecurity Interventions Other (Comment)  [f/u on meals on wheels]       Advanced Directives Status:Not addressed in this encounter.     Care Plan    Conditions to be addressed/monitored per PCP order: , Community resources  Care Plan : General Social Work (Adult)  Updates made by Soundra Pilon, LCSW since 07/05/2021 12:00 AM     Problem: Need Community support      Goal: Barriers to Treatment Identified and Managed   Start Date: 04/01/2021  This Visit's Progress: On track  Recent Progress: On track  Priority: High  Note:   Current barriers:   Financial constraints related to fixed income,  Limited social support, Limited access to food,  Literacy concerns, and Limited education about enhanced benefits Clinical Goals: Patient will work with LCSW to address unmet needs  Clinical Interventions:  Inter-disciplinary care team collaboration (see longitudinal plan of care) Solution-Focused Strategies, Active listening / Reflection utilized , Emotional Supportive Provided, Problem Solving /Task Center , Collaborated with [facility] Healthy Benefits plus [phone] with patient on the line to connect with benefits card, assisted with changing address , and Made referral to meals on wheels via  Cares. ( Meals on wheels referral excepted per [facility] referral placed) New Message sent to [facility] with [employer] to follow up n the meals on wheels application  [facility] Healthy Benefits card received  Patient Goals/Self-Care Activities:  I have placed a referral for meals on wheels. Call Healthy Benefits if you have questions call [phone]  you will have $220 per month on your card you can go  to the store or call to place an order [phone] or [phone]     Paul Hines, LCSW Care Management & Coordination  [facility] Family Medicine / [facility] [employer]   [phone]

## 2021-07-21 ENCOUNTER — Ambulatory Visit (INDEPENDENT_AMBULATORY_CARE_PROVIDER_SITE_OTHER): Payer: Medicare Other | Admitting: Family Medicine

## 2021-07-21 ENCOUNTER — Other Ambulatory Visit: Payer: Self-pay

## 2021-07-21 VITALS — BP 120/78 | HR 72 | Wt 172.0 lb

## 2021-07-21 DIAGNOSIS — R0602 Shortness of breath: Secondary | ICD-10-CM | POA: Diagnosis not present

## 2021-07-21 DIAGNOSIS — W19XXXA Unspecified fall, initial encounter: Secondary | ICD-10-CM

## 2021-07-21 DIAGNOSIS — F4321 Adjustment disorder with depressed mood: Secondary | ICD-10-CM | POA: Diagnosis not present

## 2021-07-21 DIAGNOSIS — R2689 Other abnormalities of gait and mobility: Secondary | ICD-10-CM

## 2021-07-21 DIAGNOSIS — R296 Repeated falls: Secondary | ICD-10-CM

## 2021-07-21 HISTORY — DX: Repeated falls: R29.6

## 2021-07-21 HISTORY — DX: Unspecified fall, initial encounter: W19.XXXA

## 2021-07-21 NOTE — Assessment & Plan Note (Signed)
SPO2 at rest 98%, ambulatory 96%.  Lung exam CTAB today.  Discussed extensively that he does not qualify for the portable oxygen he is requesting and that it frankly would not help with his exertional shortness of breath given his SPO2.  Patient appears dissatisfied, but accepts this answer.  Focused on increasing nutritional intake, as he has not been eating much since his wife died.  We will ask him to focus on protein intake and try Ensure or boost drinks to supplement meals if he does not feel hungry.

## 2021-07-21 NOTE — Patient Instructions (Signed)
It was wonderful to meet you today. Thank you for allowing me to be a part of your care. Below is a short summary of what we discussed at your visit today:  Falls, shortness of breath Your oxygen numbers were normal today. Adding an oxygen tank will not provide any benefit to your symptoms.   We will send you to neurovestibular rehab. Someone from their office should be calling you to set up an appointment. Let us know if you don't hear from them.   Take one or two Boost or Ensure protein drinks per day.     If you have any questions or concerns, please do not hesitate to contact us via phone or MyChart message.   Fayette Pho, MD

## 2021-07-21 NOTE — Progress Notes (Signed)
SUBJECTIVE:   CHIEF COMPLAINT / HPI:   Request for portable oxygen - Patient reports feeling short of breath with activities and climbing stairs - Has seen portable oxygen tanks advertised on TV - Requests approval from physician today for portable oxygen - Resting SPO2 98%, ambulatory 96%  Recurrent falls - Patient reports 3 falls in the last month or so - Seems to be mechanical in nature, such as he missed the last step coming down a flight of stairs - He reports he "gets disoriented, off balance" - Denies symptoms of vertigo or orthostatic hypotension symptoms  Grief, positive PHQ-9 - Reports he lost his wife 4 months ago - Patient says he knew she had cancer, but that she had told him it was stable when in reality it was progressing - Becomes tearful when talking about waking up to find she had passed in her sleep - He has children and grandchildren, but they are not able to visit his much as he would like - He is not eating very well as his wife used to do all the cooking - Relates that he does not find much purpose in life and thinks about dying often - Says he wished he had "gone with her" - Denies any intent or plan to harm himself or attempt suicide  PERTINENT  PMH / PSH:  Patient Active Problem List   Diagnosis Date Noted   Falls 07/21/2021   Radiculopathy 06/08/2021   Grief reaction 07/19/2019   Seasonal allergic rhinitis 10/02/2018   Impaired memory 10/02/2018   Headache 06/20/2018   Shortness of breath 05/28/2013   Erectile dysfunction 02/14/2012   PAGET'S DISEASE 08/17/2010   BENIGN PROSTATIC HYPERTROPHY, WITH URINARY OBSTRUCTION 03/09/2009   URINARY INCONTINENCE 03/09/2009   HYPERLIPIDEMIA 11/28/2008   INSOMNIA 11/28/2008   Essential hypertension 11/13/2008   GASTROESOPHAGEAL REFLUX DISEASE, MILD 11/13/2008   CONSTIPATION, CHRONIC 11/13/2008     OBJECTIVE:   BP 120/78   Pulse 72   Wt 172 lb (78 kg)   SpO2 98%   BMI 28.62 kg/m    PHQ-9:   Depression screen [facility] 2/9 07/21/2021 06/03/2021 03/02/2021  Decreased Interest 3 1 0  Down, Depressed, Hopeless 0 2 1  PHQ - 2 Score 3 3 1   Altered sleeping 2 2 0  Tired, decreased energy 2 2 3   Change in appetite 2 1 3   Feeling bad or failure about yourself  2 2 3   Trouble concentrating 3 0 -  Moving slowly or fidgety/restless 3 1 0  Suicidal thoughts 2 1 3   PHQ-9 Score 19 12 13   Difficult doing work/chores - - Very difficult     GAD-7: No flowsheet data found.   Physical Exam General: Awake, alert, oriented, intermittently tearful Cardiovascular: Regular rate and rhythm, S1 and S2 present, no murmurs auscultated Respiratory: Lung fields clear to auscultation bilaterally Extremities: No bilateral lower extremity edema, palpable pedal and pretibial pulses bilaterally Neuro: Cranial nerves II through X grossly intact, able to move all extremities spontaneously  ASSESSMENT/PLAN:   Grief reaction Continues to experience immense grief.  PHQ-9 score of 19 today.  Positive question 9, patient denies plan or intent. Placed CCM referral to help connect with grief counseling resources.  Shortness of breath SPO2 at rest 98%, ambulatory 96%.  Lung exam CTAB today.  Discussed extensively that he does not qualify for the portable oxygen he is requesting and that it frankly would not help with his exertional shortness of breath given his SPO2.  Patient appears dissatisfied, but accepts this answer.  Focused on increasing nutritional intake, as he has not been eating much since his wife died.  We will ask him to focus on protein intake and try Ensure or boost drinks to supplement meals if he does not feel hungry.  Falls New.  3 falls in the last month or so.  Sound be mechanical in nature.  No vestibular, and vertigo, or orthostatic symptoms.  No meds currently taking that would contribute. Will refer to physical therapy.     Fayette Pho, MD [facility]

## 2021-07-21 NOTE — Assessment & Plan Note (Addendum)
New.  3 falls in the last month or so.  Sound be mechanical in nature.  No vestibular, and vertigo, or orthostatic symptoms.  No meds currently taking that would contribute. Will refer to physical therapy.

## 2021-07-21 NOTE — Assessment & Plan Note (Signed)
Continues to experience immense grief.  PHQ-9 score of 19 today.  Positive question 9, patient denies plan or intent. Placed CCM referral to help connect with grief counseling resources.

## 2021-07-26 ENCOUNTER — Ambulatory Visit: Payer: Medicare Other | Admitting: Licensed Clinical Social Worker

## 2021-07-26 DIAGNOSIS — F4321 Adjustment disorder with depressed mood: Secondary | ICD-10-CM

## 2021-07-26 DIAGNOSIS — Z789 Other specified health status: Secondary | ICD-10-CM

## 2021-07-26 NOTE — Patient Instructions (Signed)
Visit Information  Thank you for taking time to visit with me today. Please don't hesitate to contact me if I can be of assistance to you before our next scheduled telephone appointment.  Following are the goals we discussed today:  (Copy and paste patient goals from clinical care plan here)    Please call the care guide team at [phone] if you need to cancel or reschedule your appointment.   If you are experiencing a Mental Health or Behavioral Health Crisis or need someone to talk to, please call the Suicide and Crisis Lifeline: 988 call the [place] National Suicide Prevention Lifeline: (803)037-2662 or TTY: (682) 764-9612 TTY (785) 325-8783) to talk to a trained counselor call 1-800-273-TALK (toll free, 24 hour hotline) go to [facility] [address], [place] [phone]) call 911    Please call the office if needed No follow up scheduled, per our conversation I will contact you in 3 to 4 weeks.  Please call the office if needed prior to next encounter.  [name], LCSW Care Management & Coordination  [phone]

## 2021-07-26 NOTE — Chronic Care Management (AMB) (Signed)
Care Management  Clinical Social Work Note  07/26/2021 Name: Paul [name] MRN: [id number] DOB: Dec 31, 1926  Paul [name] is a 87 y.o. year old male who is a primary care patient of Simmons-Robinson, Tawanna Cooler, MD. The CCM team was consulted for assistance with care coordination needs. Community Resources  and Grief Counseling   Consent to Services:  The patient was given information about Care Management services, agreed to services, and gave verbal consent prior to initiation of services.  Please see initial visit note for detailed documentation.   Patient agreed to services today and consent obtained.  Engaged with patient by telephone for follow up visit in response to provider referral for social work care coordination services. Assessed patient's current treatment, progress, coping skills, support system and barriers to care.  Collaborated with [facility] [place] and [facility]  in order to meet patient's ongoing needs..   Assessment includes: Review of patient past medical history, allergies, medications, and health status, including review of pertinent consultant reports was performed as part of comprehensive evaluation and provision of care management/care coordination services. See Care Plan below for interventions and patient self-care actives.   SDOH (Social Determinants of Health) screening and interventions performed today:   Advanced Directives Status:Not addressed in this encounter.     Care Plan    Conditions to be addressed/monitored per PCP order: Depression, Limited social support  Care Plan : General Social Work (Adult)  Updates made by Soundra Pilon, LCSW since 07/26/2021 12:00 AM     Problem: Need Community support      Goal: Barriers to Treatment Identified and Managed   Start Date: 04/01/2021  This Visit's Progress: On track  Recent Progress: On track  Priority: High  Note:   Current barriers:   Financial constraints related to fixed income,   Limited social support, Limited access to food,  Literacy concerns, and Limited education about enhanced benefits Clinical Goals: Patient will work with LCSW to address unmet needs  Clinical Interventions:  Inter-disciplinary care team collaboration (see longitudinal plan of care) Solution-Focused Strategies, Active listening / Reflection utilized , Emotional Supportive Provided, Problem Solving /Task Center , Collaborated with [employer] [facility] Healthy Benefits plus [phone] with patient on the line to connect with benefits card, assisted with changing address , and Made referral to meals on wheels via [employer]. ( Meals on wheels referral excepted per [facility] referral placed) Called Ben with [employer] to follow up n the meals on wheels referral, patient is getting close to top of the list will call patient   [facility] Healthy Benefits card received  Patient continues to experience difficulty with understanding how to use his card He will try again today Patient Goals/Self-Care Activities:  I have placed a referral for meals on wheels they will call you . Call Healthy Benefits if you have questions call [phone]  you will have $220 per month on your card you can go to the store or call to place an order [phone] or [phone]    Problem: Coping Skills (General Plan of Care)      Goal: Coping Skills Enhanced for symptoms of Grief   Start Date: 07/26/2021  This Visit's Progress: On track  Priority: High  Note:   Current Barriers:  Disease Management support and education needs related to Grief Lacks knowledge of how to connect  Ambivalent about going for grief counseling   CSW Clinical Goal(s):  Patient  will work with [facility] to address needs related to Grief  through  collaboration with Visual merchandiser, provider, and care team.   Interventions: Inter-disciplinary care team collaboration (see longitudinal plan of care) Evaluation of current treatment plan related to   self management and patient's adherence to plan as established by provider  Mental Health:  (Status: New goal.) Evaluation of current treatment plan related to Grief Motivational Interviewing employed Solution-Focused Strategies employed: for Grief counseling options Called [facility] [phone] and made referral for Grief counseling   Patient Self-Care Activities: I have placed a referral [facility] for counseling they will contact you.       Follow up Plan:  No follow up scheduled with CCM LCSW at this time. Will follow up with patient in 3 to 4 weeks .Patient will call office if needed prior to next encounter.   Paul Hines, LCSW Care Management & Coordination  [facility] / [employer]   [phone]

## 2021-08-03 ENCOUNTER — Other Ambulatory Visit: Payer: Self-pay | Admitting: Family Medicine

## 2021-08-03 ENCOUNTER — Telehealth: Payer: Self-pay | Admitting: Family Medicine

## 2021-08-03 DIAGNOSIS — I10 Essential (primary) hypertension: Secondary | ICD-10-CM

## 2021-08-03 NOTE — Telephone Encounter (Signed)
Refills sent to patient's pharmacy.

## 2021-08-03 NOTE — Telephone Encounter (Signed)
Pt walked in to request refill of:  Name of Medication(s):  Amlodipine Besylate and Cetirizine 5 mg  Last date of OV:  07/21/21 Pharmacy:  See chart    Will route refill request to Clinic RN.  Discussed with patient policy to call pharmacy for future refills.  Also, discussed refills may take up to 48 hours to approve or deny.  Vilinda Blanks

## 2021-08-17 ENCOUNTER — Ambulatory Visit: Payer: Medicare Other | Admitting: Licensed Clinical Social Worker

## 2021-08-17 DIAGNOSIS — Z789 Other specified health status: Secondary | ICD-10-CM

## 2021-08-17 NOTE — Chronic Care Management (AMB) (Signed)
Care Management  Clinical Social Work Note  08/17/2021 Name: Paul [name] MRN: [id number] DOB: [birth year]  Paul [name] is a 88 y.o. year old male who is a primary care patient of Simmons-Robinson, Tawanna Cooler, MD. The CCM team was consulted for assistance with care coordination needs. Level of Care Concerns   Consent to Services:  The patient was given information about Care Management services, agreed to services, and gave verbal consent prior to initiation of services.  Please see initial visit note for detailed documentation.   Patient agreed to services today and consent obtained.  Engaged with patient by telephone for follow up visit in response to provider referral for social work care coordination services. Assessed patient's current treatment, progress, coping skills, support system and barriers to care.  He continues to experience difficulty with using enhanced benefits card but to difficulty with reading and understanding how to connect the card. Referral made to Senior resources for case assistance for problem solving .   Assessment includes: Review of patient past medical history, allergies, medications, and health status, including review of pertinent consultant reports was performed as part of comprehensive evaluation and provision of care management/care coordination services. See Care Plan below for interventions and patient self-care actives.   SDOH (Social Determinants of Health) screening and interventions performed today:   Advanced Directives Status:Not addressed in this encounter.     Care Plan    Conditions to be addressed/monitored per PCP order: , Limited social support  Care Plan : General Social Work (Adult)  Updates made by Paul Pilon, LCSW since 08/17/2021 12:00 AM     Problem: Need Community support      Goal: Barriers to Treatment Identified and Managed   Start Date: 04/01/2021  Recent Progress: On track  Priority: High  Note:   Current barriers:    Financial constraints related to fixed income,  Limited social support, Limited access to food,  Literacy concerns, and Limited education about enhanced benefits Clinical Goals: Patient will work with LCSW to address unmet needs  Clinical Interventions:  Inter-disciplinary care team collaboration (see longitudinal plan of care) Solution-Focused Strategies, Active listening / Reflection utilized , Emotional Supportive Provided, Problem Solving /Task Center , Collaborated with [facility] Healthy Benefits plus [phone] with patient on the line to connect with benefits card, assisted with changing address , and Made referral to meals on wheels via [employer]. ( Meals on wheels referral excepted per [facility] referral placed) Called Ben with [employer] to follow up n the meals on wheels referral, patient is getting close to top of the list will call patient   [employer] 08/17/21  to place referral for Case Assistance program and to assess for other internal referrals [facility] Healthy Benefits card received  Patient continues to experience difficulty with understanding how to use his enhanced benefits card He will go to his friends house today to see if she is able to assist him Patient Goals/Self-Care Activities:  I have placed a referral for meals on wheels they will call you . Call Healthy Benefits if you have questions about your card call [phone]  There is money on your Benefits card each month for shopping or paying bills you can call to place an order [phone] or [phone]     Follow up Plan: Will provide warm hand off to CCM BSW to assist with patient's ongoing needs. ( Goal is to make sure he is able to use his enhanced benefits card and has community support via [employer]  Resources) Patient would like continued follow-up from CCM BSW.  Referral sent to care guide to schedule appointment with CCM BSW in 3 to 4 weeks   Paul Hines, LCSW Care Management & Coordination   [facility] / [employer]   [phone]

## 2021-08-17 NOTE — Patient Instructions (Addendum)
Visit Information  Thank you for taking time to visit with me today. Please don't hesitate to contact me if I can be of assistance to you before our next scheduled telephone appointment.  Following are the goals we discussed today:  Barriers with Enhanced Benefits Card Patient Goals/Self-Care Activities:  I have placed a referral for meals on wheels and Case Assistance program with [employer] they will call you . Call Healthy Benefits if you have questions about your card call [phone]  There is money on your Benefits card each month for shopping or paying bills you can call to place an order [phone] or [phone]   Please call the care guide team at [phone] if you need to cancel or reschedule your appointment.   If you are experiencing a Mental Health or Behavioral Health Crisis or need someone to talk to, please call 1-800-273-TALK (toll free, 24 hour hotline)    The Care Guide will contact you to reschedule the phone appointment with the new social worker I will disconnect from your care team at this time, please call the office if additional needs are identified  Patient verbalized understanding of instructions, educational materials, and care plan provided today and declined offer to receive copy of patient instructions, educational materials, and care plan.  [name], LCSW Care Management & Coordination  [phone]

## 2021-09-08 ENCOUNTER — Ambulatory Visit: Payer: Commercial Managed Care - HMO | Admitting: Licensed Clinical Social Worker

## 2021-09-08 NOTE — Chronic Care Management (AMB) (Signed)
°  Care Management   Social Work Visit Note  09/08/2021 Name: Paul [name] MRN: [id number] DOB: 28-Nov-1926  Paul [name] is a 87 y.o. year old male who sees Simmons-Robinson, Tawanna Cooler, MD for primary care. The care management team was consulted for assistance with care management and care coordination needs related to [facility] Resources    Patient was given the following information about care management and care coordination services today, agreed to services, and gave verbal consent: 1.care management/care coordination services include personalized support from designated clinical staff supervised by their physician, including individualized plan of care and coordination with other care providers 2. 24/7 contact phone numbers for assistance for urgent and routine care needs. 3. The patient may stop care management/care coordination services at any time by phone call to the office staff.  Engaged with patient by telephone for follow up visit in response to provider referral for social work chronic care management and care coordination services.  Assessment: Review of patient history, allergies, and health status during evaluation of patient need for care management/care coordination services.    Interventions:  Patient interviewed and appropriate assessments performed Collaborated with clinical team regarding patient needs  SW called and verified Meals [employer] Wheels will be contacting patient at 11am on today.  Patient received benefit card and benefits are active.  Patient advised he went to use card and card was declined. SW gave patient Healthy Benefits contact information to inquire why purchase was declined.        Plan:  patient will work with BSW to address needs related to Limited access to food Child psychotherapist will make another follow up call within the next 30 days.   Christen Butter, BSW  Social Worker [facility] Care Management  [phone]

## 2021-09-08 NOTE — Patient Instructions (Signed)
Visit Information  Instructions: patient will work with SW to address concerns related to community resources  Patient was given the following information about care management and care coordination services today, agreed to services, and gave verbal consent: 1.care management/care coordination services include personalized support from designated clinical staff supervised by their physician, including individualized plan of care and coordination with other care providers 2. 24/7 contact phone numbers for assistance for urgent and routine care needs. 3. The patient may stop care management/care coordination services at any time by phone call to the office staff.  Patient verbalizes understanding of instructions and care plan provided today and agrees to view in MyChart. Active MyChart status confirmed with patient.    The care management team will reach out to the patient again over the next 30 days.   Christen Butter, BSW  Social Worker [facility] Care Management  [phone]

## 2021-09-29 ENCOUNTER — Ambulatory Visit (INDEPENDENT_AMBULATORY_CARE_PROVIDER_SITE_OTHER): Payer: Medicare Other | Admitting: Family Medicine

## 2021-09-29 ENCOUNTER — Encounter: Payer: Self-pay | Admitting: Family Medicine

## 2021-09-29 ENCOUNTER — Other Ambulatory Visit: Payer: Self-pay

## 2021-09-29 DIAGNOSIS — Z0000 Encounter for general adult medical examination without abnormal findings: Secondary | ICD-10-CM | POA: Diagnosis not present
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
## 2021-10-01 ENCOUNTER — Encounter: Payer: Self-pay | Admitting: Family Medicine
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
## 2022-01-28 ENCOUNTER — Ambulatory Visit: Payer: Medicare Other | Admitting: Family Medicine

## 2022-01-28 NOTE — Progress Notes (Deleted)
    SUBJECTIVE:   CHIEF COMPLAINT / HPI: feet swelling   Feet have been swelling for ***  Pain associated with swelling *** Increased salt in diet *** Trauma to feet ***  Shortness of breath ***  Changes to medications ***    PERTINENT  PMH / PSH:  HTN  Memory Impairment  Paget's disease    OBJECTIVE:   There were no vitals taken for this visit.  Physical Exam   ASSESSMENT/PLAN:   No problem-specific Assessment & Plan notes found for this encounter.     Ronnald Ramp, MD [facility]

## 2022-04-29 ENCOUNTER — Ambulatory Visit (INDEPENDENT_AMBULATORY_CARE_PROVIDER_SITE_OTHER): Payer: Medicare Other | Admitting: Student

## 2022-04-29 VITALS — BP 135/75 | HR 78 | Ht 65.0 in | Wt 179.0 lb

## 2022-04-29 DIAGNOSIS — R600 Localized edema: Secondary | ICD-10-CM | POA: Diagnosis not present

## 2022-04-29 DIAGNOSIS — M25542 Pain in joints of left hand: Secondary | ICD-10-CM | POA: Diagnosis not present

## 2022-04-29 DIAGNOSIS — M25541 Pain in joints of right hand: Secondary | ICD-10-CM

## 2022-04-29 DIAGNOSIS — I10 Essential (primary) hypertension: Secondary | ICD-10-CM

## 2022-04-29 HISTORY — DX: Localized edema: R60.0

## 2022-04-29 MED ORDER — DICLOFENAC SODIUM 1 % EX GEL: 4.0000 g | Freq: Four times a day (QID) | CUTANEOUS | 1 refills | Status: DC

## 2022-04-29 MED ORDER — AMLODIPINE BESYLATE 5 MG PO TABS: ORAL_TABLET | ORAL | 3 refills | Status: DC

## 2022-04-29 NOTE — Patient Instructions (Addendum)
It was great to see you! Thank you for allowing me to participate in your care!  I recommend that you always bring your medications to each appointment as this makes it easy to ensure you are on the correct medications and helps [place] not miss when refills are needed.  Our plans for today:  - I sent in a prescription for Voltaren Gel for the pain in your finger joints. You can apply this up to 4 times a day as needed -We will follow up based on your lab work.   We are checking some labs today, I will call you if they are abnormal will send you a MyChart message or a letter if they are normal.  If you do not hear about your labs in the next 2 weeks please let us know.  Take care and seek immediate care sooner if you develop any concerns.   Dr. Erick Alley, DO [facility]
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
## 2022-04-29 NOTE — Assessment & Plan Note (Signed)
Pain in PIP joints of bilateral hands likely due to OA as pain improves throughout the day.  Patient advised to use Voltaren gel up to 4 times a day as needed.

## 2022-04-30 LAB — COMPREHENSIVE METABOLIC PANEL
ALT: 13 IU/L (ref 0–44)
AST: 19 IU/L (ref 0–40)
Albumin/Globulin Ratio: 1.6 (ref 1.2–2.2)
Albumin: 4.9 g/dL — ABNORMAL HIGH (ref 3.6–4.6)
Alkaline Phosphatase: 101 IU/L (ref 44–121)
BUN/Creatinine Ratio: 12 (ref 10–24)
BUN: 16 mg/dL (ref 10–36)
Bilirubin Total: 0.5 mg/dL (ref 0.0–1.2)
CO2: 22 mmol/L (ref 20–29)
Calcium: 9.6 mg/dL (ref 8.6–10.2)
Chloride: 98 mmol/L (ref 96–106)
Creatinine, Ser: 1.39 mg/dL — ABNORMAL HIGH (ref 0.76–1.27)
Globulin, Total: 3 g/dL (ref 1.5–4.5)
Glucose: 92 mg/dL (ref 70–99)
Potassium: 4.8 mmol/L (ref 3.5–5.2)
Sodium: 137 mmol/L (ref 134–144)
Total Protein: 7.9 g/dL (ref 6.0–8.5)
eGFR: 48 mL/min/{1.73_m2} — ABNORMAL LOW (ref >59)

## 2022-04-30 LAB — CBC
Hematocrit: 46.9 % (ref 37.5–51.0)
Hemoglobin: 15.7 g/dL (ref 13.0–17.7)
MCH: 31.5 pg (ref 26.6–33.0)
MCHC: 33.5 g/dL (ref 31.5–35.7)
MCV: 94 fL (ref 79–97)
Platelets: 228 10*3/uL (ref 150–450)
RBC: 4.99 x10E6/uL (ref 4.14–5.80)
RDW: 12 % (ref 11.6–15.4)
WBC: 7.1 10*3/uL (ref 3.4–10.8)

## 2022-04-30 LAB — BRAIN NATRIURETIC PEPTIDE: BNP: 24.2 pg/mL (ref 0.0–100.0)

## 2022-04-30 LAB — TSH: TSH: 3.82 u[IU]/mL (ref 0.450–4.500)

## 2022-05-02 ENCOUNTER — Other Ambulatory Visit: Payer: Self-pay | Admitting: Student

## 2022-05-02 ENCOUNTER — Telehealth: Payer: Self-pay | Admitting: Student

## 2022-05-02 DIAGNOSIS — J302 Other seasonal allergic rhinitis: Secondary | ICD-10-CM

## 2022-05-02 MED ORDER — CETIRIZINE HCL 5 MG PO TABS: ORAL_TABLET | ORAL | 2 refills | Status: DC

## 2022-05-02 NOTE — Telephone Encounter (Signed)
Called to discuss lab work with patient. No explanation for bilateral LE edema reflected in labs. Could possibly be due to his amlodipine. Scheduled pt apt with his PCP on 10/2 to further discuss and meet PCP.

## 2022-05-06 NOTE — Telephone Encounter (Signed)
Patient LVM on nurse line requesting returned call.   Called patient back, no answer and no ability to LVM.   Talbot Grumbling, RN

## 2022-05-16 ENCOUNTER — Encounter: Payer: Self-pay | Admitting: Family Medicine

## 2022-05-16 ENCOUNTER — Ambulatory Visit (INDEPENDENT_AMBULATORY_CARE_PROVIDER_SITE_OTHER): Payer: Medicare Other | Admitting: Family Medicine

## 2022-05-16 VITALS — BP 120/70 | HR 76 | Ht 65.0 in | Wt 179.0 lb

## 2022-05-16 DIAGNOSIS — N179 Acute kidney failure, unspecified: Secondary | ICD-10-CM

## 2022-05-16 DIAGNOSIS — R7989 Other specified abnormal findings of blood chemistry: Secondary | ICD-10-CM

## 2022-05-16 DIAGNOSIS — R600 Localized edema: Secondary | ICD-10-CM | POA: Diagnosis not present

## 2022-05-16 DIAGNOSIS — Z23 Encounter for immunization: Secondary | ICD-10-CM

## 2022-05-16 MED ORDER — ZOSTER VAC RECOMB ADJUVANTED 50 MCG/0.5ML IM SUSR: 0.5000 mL | Freq: Once | INTRAMUSCULAR | 0 refills | Status: AC

## 2022-05-16 NOTE — Patient Instructions (Addendum)
Good to see you today - Thank you for coming in  Things we discussed today:  1) Leg Swelling: Your leg swelling is most likely due to your Amlodipine (blood pressure medication). Instead of taking it twice a day, let's change it to taking Amlodipine 5mg  once a day.  - I will check your kidneys with a blood draw.  - Come back in 4 weeks to repeat a blood draw, recheck your blood pressure, and follow-up on your leg swelling  2) Vaccines: You are due for a flu, COVID, and shingles vaccine. You will get the flu shot today. You can go to your pharmacy to get your shingles and covid shots.  Come back to see me in 4 wks to recheck your blood pressure and kidneys.  Please bring in your medications!

## 2022-05-16 NOTE — Progress Notes (Signed)
    SUBJECTIVE:   CHIEF COMPLAINT / HPI: Leg and hand swelling  Paul [name] is a 88yo M w/ HTN, HLD, and hx of BL LE edema and PIP edema. He was previously seen by Dr Ronnald Ramp and given voltaren gel. He reports that the swelling is improving now but still there. He has trouble making a fist with his hands because of how tight his PIP knuckles are. He has also been taking amlodipine twice a day, instead of once a day like prescribed. He denies any pain from the swelling.   PERTINENT  PMH / PSH: as above  OBJECTIVE:   BP 120/70   Pulse 76   Ht 5\' 5"  (1.651 m)   Wt 179 lb (81.2 kg)   SpO2 95%   BMI 29.79 kg/m   Gen: Well-appearing, comfotably sitting in chair. NAD. Resp: Normal WOB on RA Msk: 2+ pitting edema in BL LE. PIP joints mildly enlarged, but all nontender and nonerythematous. Able to make fists with both hands, but grip strength 4/5 BL.    ASSESSMENT/PLAN:   AKI (acute kidney injury) ([place]) Pt has uptrending Cr over the past year, now 1.62. Denies NSAID use. Reports normal fluid PO. His BP could be too controlled given his age and may be conrtibuting, especially since he is accidentally taking double the dose of amlodipine. - Discussed taking amlodipine 5mg  only once daily - f/u in 4wks for repeat BMP - Encouraged increasing fluid intake.  Lower extremity edema Regular BNP at last visit makes this less likely due to CHF. Per pt, the edema is starting to improve after apply Voltaren (although I suspect that voltaren is not related to the improvement). 2+ pitting edema on exam, but pt denies any pain. - CTM for improvement at f/u visit   Received flu shot today. Advised pt to go to pharmacy for shingrex and covid booster.  Arlyce Dice, MD [place]

## 2022-05-17 ENCOUNTER — Telehealth: Payer: Self-pay | Admitting: Family Medicine

## 2022-05-17 DIAGNOSIS — N179 Acute kidney failure, unspecified: Secondary | ICD-10-CM | POA: Insufficient documentation

## 2022-05-17 LAB — BASIC METABOLIC PANEL
BUN/Creatinine Ratio: 12 (ref 10–24)
BUN: 20 mg/dL (ref 10–36)
CO2: 23 mmol/L (ref 20–29)
Calcium: 9.9 mg/dL (ref 8.6–10.2)
Chloride: 99 mmol/L (ref 96–106)
Creatinine, Ser: 1.62 mg/dL — ABNORMAL HIGH (ref 0.76–1.27)
Glucose: 109 mg/dL — ABNORMAL HIGH (ref 70–99)
Potassium: 4.6 mmol/L (ref 3.5–5.2)
Sodium: 138 mmol/L (ref 134–144)
eGFR: 40 mL/min/{1.73_m2} — ABNORMAL LOW (ref >59)

## 2022-05-17 NOTE — Assessment & Plan Note (Signed)
Pt has uptrending Cr over the past year, now 1.62. Denies NSAID use. Reports normal fluid PO. His BP could be too controlled given his age and may be conrtibuting, especially since he is accidentally taking double the dose of amlodipine. - Discussed taking amlodipine 5mg  only once daily - f/u in 4wks for repeat BMP - Encouraged increasing fluid intake.

## 2022-05-17 NOTE — Assessment & Plan Note (Signed)
Regular BNP at last visit makes this less likely due to CHF. Per pt, the edema is starting to improve after apply Voltaren (although I suspect that voltaren is not related to the improvement). 2+ pitting edema on exam, but pt denies any pain. - CTM for improvement at f/u visit

## 2022-05-17 NOTE — Telephone Encounter (Signed)
Called to inform that his Cr is uptrending. He reports only taking his prescribed meds and Omega XL (joint and muscle) and Joint Food supplements. Denies taking OTC NSAIDs or any pain meds. Reports normal fluid intake and no diarrhea. - I encouraged him to increase his fluid intake and f/u for a repeat BMP

## 2022-06-16 ENCOUNTER — Ambulatory Visit (INDEPENDENT_AMBULATORY_CARE_PROVIDER_SITE_OTHER): Payer: Medicare Other | Admitting: Family Medicine

## 2022-06-16 VITALS — BP 138/64 | HR 83 | Wt 180.6 lb

## 2022-06-16 DIAGNOSIS — Z9181 History of falling: Secondary | ICD-10-CM

## 2022-06-16 DIAGNOSIS — R7989 Other specified abnormal findings of blood chemistry: Secondary | ICD-10-CM | POA: Diagnosis not present

## 2022-06-16 DIAGNOSIS — Z23 Encounter for immunization: Secondary | ICD-10-CM | POA: Diagnosis not present

## 2022-06-16 DIAGNOSIS — W19XXXS Unspecified fall, sequela: Secondary | ICD-10-CM

## 2022-06-16 DIAGNOSIS — N179 Acute kidney failure, unspecified: Secondary | ICD-10-CM

## 2022-06-16 DIAGNOSIS — I10 Essential (primary) hypertension: Secondary | ICD-10-CM

## 2022-06-16 DIAGNOSIS — R600 Localized edema: Secondary | ICD-10-CM | POA: Diagnosis not present

## 2022-06-16 NOTE — Patient Instructions (Addendum)
Good to see you today - Thank you for coming in  Things we discussed today:  1) Your leg swelling looks so much better!  - Continue drinking water and staying hydrated - Continue taking Amlodipine 5mg   2) Balance - I sent a referral for Physical Therapy. They can help you with exercises to improve balance - Use a walker or cane to help with balance  Come back to see me 12/4 at 2:10pm

## 2022-06-16 NOTE — Progress Notes (Signed)
    SUBJECTIVE:   CHIEF COMPLAINT / HPI:   Paul [name] is a 88yo M that is h/f f/u of LE edema. Pt was previously advised to decrease Amlodipine 10 to 5 daily. He has also increased his water intake as advised. He reports that his leg swelling is much improved and he is very satisfied.   He also reports he feels like he doesn't have as much balance as he used to. He denies falls or LOC or dizziness. He does exercise (push-ups, walking).   Social Hx Grandson 27 comes to check on him at home everyday. Pt gets meals on wheels. Pt uses a walker at home.   PERTINENT  PMH / PSH: HTN, HLD  OBJECTIVE:   BP 138/64   Pulse 83   Wt 180 lb 9.6 oz (81.9 kg)   SpO2 98%   BMI 30.05 kg/m   Gen: Pleasant older man, sitting up in chair. NAD. HEENT: NCAT. Sclera anicteric. MMM. Resp: Normal WOB on RA. CTAB. CV: RRR Msk: Push off chair to standing Stand up and go (without walker): 16 seconds (prolonged)   ASSESSMENT/PLAN:   Lower extremity edema Improved since decreasing amlodipine to 5mg  daily.  Essential hypertension At goal on Amlo 5mg  - Consider switching to ARB or potentially discontinuing BP med at next visit. Amlo caused LE edema and he had an AKI that improved after decreasing dose, which could be sign of over-treated BP.  AKI (acute kidney injury) ([place]) Cr downtrended today after decreasing amlodipine.  Falls Has hx of falls and timed-up&go prolonged, suggesting increased fall risk. Pt reports exercise with daily pushups and going on walks. He is agreeable to working with PT, he prefers to go to a facility rather than have home visits. - Referral to PT - Consider orthostatics at f/u   Paul Dice, MD [place]

## 2022-06-17 ENCOUNTER — Encounter: Payer: Self-pay | Admitting: Family Medicine

## 2022-06-17 LAB — BASIC METABOLIC PANEL
BUN/Creatinine Ratio: 16 (ref 10–24)
BUN: 21 mg/dL (ref 10–36)
CO2: 23 mmol/L (ref 20–29)
Calcium: 9.4 mg/dL (ref 8.6–10.2)
Chloride: 102 mmol/L (ref 96–106)
Creatinine, Ser: 1.34 mg/dL — ABNORMAL HIGH (ref 0.76–1.27)
Glucose: 101 mg/dL — ABNORMAL HIGH (ref 70–99)
Potassium: 4 mmol/L (ref 3.5–5.2)
Sodium: 142 mmol/L (ref 134–144)
eGFR: 50 mL/min/{1.73_m2} — ABNORMAL LOW (ref >59)

## 2022-06-20 NOTE — Progress Notes (Unsigned)
   Covid-19 Vaccination Clinic  Name:  Paul [name]    MRN: [id number] DOB: 1927-08-07  06/16/2022  [name] was observed post Covid-19 immunization for 15 minutes without incident. He was provided with Vaccine Information Sheet and instruction to access the V-Safe system.   [name] was instructed to call [phone] with any severe reactions post vaccine: Difficulty breathing  Swelling of face and throat  A fast heartbeat  A bad rash all over body  Dizziness and weakness     Talbot Grumbling, RN

## 2022-06-21 NOTE — Assessment & Plan Note (Signed)
Cr downtrended today after decreasing amlodipine.

## 2022-06-21 NOTE — Assessment & Plan Note (Signed)
Has hx of falls and timed-up&go prolonged, suggesting increased fall risk. Pt reports exercise with daily pushups and going on walks. He is agreeable to working with PT, he prefers to go to a facility rather than have home visits. - Referral to PT - Consider orthostatics at f/u

## 2022-06-21 NOTE — Assessment & Plan Note (Addendum)
Improved since decreasing amlodipine to 5mg  daily.

## 2022-06-21 NOTE — Assessment & Plan Note (Addendum)
At goal on Amlo 5mg  - Consider switching to ARB or potentially discontinuing BP med at next visit. Amlo caused LE edema and he had an AKI that improved after decreasing dose, which could be sign of over-treated BP.

## 2022-07-18 ENCOUNTER — Ambulatory Visit (INDEPENDENT_AMBULATORY_CARE_PROVIDER_SITE_OTHER): Payer: Medicare Other | Admitting: Family Medicine

## 2022-07-18 ENCOUNTER — Encounter: Payer: Self-pay | Admitting: Family Medicine

## 2022-07-18 VITALS — BP 139/76 | HR 84 | Wt 182.1 lb

## 2022-07-18 DIAGNOSIS — N401 Enlarged prostate with lower urinary tract symptoms: Secondary | ICD-10-CM | POA: Diagnosis not present

## 2022-07-18 DIAGNOSIS — R3914 Feeling of incomplete bladder emptying: Secondary | ICD-10-CM | POA: Diagnosis not present

## 2022-07-18 DIAGNOSIS — I10 Essential (primary) hypertension: Secondary | ICD-10-CM

## 2022-07-18 MED ORDER — TAMSULOSIN HCL 0.4 MG PO CAPS: 0.4000 mg | ORAL_CAPSULE | Freq: Every day | ORAL | 3 refills | Status: DC
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
## 2022-07-18 NOTE — Patient Instructions (Addendum)
Good to see you today - Thank you for coming in  Things we discussed today:  1) For your urinary symptoms: - Take Flomax 0.4mg  once a day. This will help you with emptying your bladder.  2) For blood pressure, let's wait to change your medications.  Please always bring your medication bottles  Come back to see me in 1 month

## 2022-07-19 NOTE — Assessment & Plan Note (Signed)
Has history of BPH and was on Flomax.  Flomax was discontinued but no reason documented.  Patient reports embarrassment from urine incontinence in his depends and not realizing he urinated.  Also reports feeling incomplete emptying after urinating.  The symptoms are concerning for overflow incontinence. - Restart Flomax 0.4mg  daily

## 2022-07-19 NOTE — Assessment & Plan Note (Signed)
At goal. Orthostatic BP's negative. - Amlo 5mg  daily

## 2022-09-09 ENCOUNTER — Other Ambulatory Visit: Payer: Self-pay | Admitting: *Deleted

## 2022-09-09 ENCOUNTER — Telehealth: Payer: Self-pay | Admitting: Family Medicine

## 2022-09-09 DIAGNOSIS — I10 Essential (primary) hypertension: Secondary | ICD-10-CM

## 2022-09-09 DIAGNOSIS — J302 Other seasonal allergic rhinitis: Secondary | ICD-10-CM

## 2022-09-09 MED ORDER — CETIRIZINE HCL 5 MG PO TABS: ORAL_TABLET | ORAL | 2 refills | Status: DC

## 2022-09-09 NOTE — Telephone Encounter (Signed)
Patient came in needs refill Zyrtec 5 mg.  [facility] [address]  Any questions please call patient.

## 2022-09-10 MED ORDER — AMLODIPINE BESYLATE 5 MG PO TABS: ORAL_TABLET | ORAL | 3 refills | Status: AC

## 2022-09-23 ENCOUNTER — Encounter: Payer: Self-pay | Admitting: Family Medicine

## 2022-09-23 ENCOUNTER — Other Ambulatory Visit: Payer: Self-pay

## 2022-09-23 ENCOUNTER — Ambulatory Visit (INDEPENDENT_AMBULATORY_CARE_PROVIDER_SITE_OTHER): Payer: 59 | Admitting: Family Medicine

## 2022-09-23 VITALS — BP 165/85 | HR 88 | Ht 65.0 in | Wt 182.8 lb

## 2022-09-23 DIAGNOSIS — N401 Enlarged prostate with lower urinary tract symptoms: Secondary | ICD-10-CM

## 2022-09-23 DIAGNOSIS — M25542 Pain in joints of left hand: Secondary | ICD-10-CM | POA: Diagnosis not present

## 2022-09-23 DIAGNOSIS — M1990 Unspecified osteoarthritis, unspecified site: Secondary | ICD-10-CM | POA: Diagnosis not present

## 2022-09-23 DIAGNOSIS — M25541 Pain in joints of right hand: Secondary | ICD-10-CM

## 2022-09-23 DIAGNOSIS — I10 Essential (primary) hypertension: Secondary | ICD-10-CM | POA: Diagnosis not present

## 2022-09-23 MED ORDER — DICLOFENAC SODIUM 1 % EX GEL: 4.0000 g | Freq: Four times a day (QID) | CUTANEOUS | 1 refills | Status: AC

## 2022-09-23 NOTE — Progress Notes (Signed)
    SUBJECTIVE:   CHIEF COMPLAINT / HPI:   Paul [name] is a 89 yo M presenting w/ pain in his R knee.   R Knee Pain Reports R knee pain that occurs when he walks. Does not occur at rest. He stretches and moves his legs in the morning to help warm them up. The pain occur diffusely in the front of knee. Voltaren has helped in the past.  BPH Since starting flomax, he feels like his urinary symptoms are slightly improving. He wears Depends diapers to help w/ urinary incontinence.   HTN Pt did not take his amlodipine this AM.  PERTINENT  PMH / PSH:  HTN, BPH, HLD   OBJECTIVE:   BP (!) 165/85   Pulse 88   Ht 5\' 5"  (1.651 m)   Wt 182 lb 12.8 oz (82.9 kg)   SpO2 100%   BMI 30.42 kg/m   Gen: Alert, pleasant, appears younger than stated age. NAD. Resp: CTAB. Normal WOB on RA CV: RRR MSK: Full ROM on R knee. Pain not illicited w/ palpation.   ASSESSMENT/PLAN:   Essential hypertension BP elevated today. Did not take AM dose of amlodipine. Has hx of fall risk. Share decision making, pt agrees to stay at current dose for today and recheck in future. - Cont amlo 5mg  daily  BPH (benign prostatic hyperplasia) Subjectively reports that his urinary symptoms seem to have improved since starting flomax.  - Cont Flomax 0.4mg  daily  Osteoarthritis Has hx of R knee pain with walking, pain located in anterior of knee. Pain only illicited with walking. Not reproducible w/ palpation. Full ROM. No crepitus felt. Pt reports that voltaren has helped before. - Refilled Voltaren for R knee OA    Lincoln Brigham, MD [facility]

## 2022-09-23 NOTE — Patient Instructions (Signed)
Good to see you today - Thank you for coming in  Things we discussed today:  1) For your right Knee Pain, apply Voltaren gel to the area up to 4 times a day.  2) For your urinary symptoms, continue taking your Flomax once a day.  Please always bring your medication bottles  Come back to see me in 1 month

## 2022-09-25 DIAGNOSIS — M1990 Unspecified osteoarthritis, unspecified site: Secondary | ICD-10-CM | POA: Insufficient documentation

## 2022-09-25 NOTE — Assessment & Plan Note (Signed)
Has hx of R knee pain with walking, pain located in anterior of knee. Pain only illicited with walking. Not reproducible w/ palpation. Full ROM. No crepitus felt. Pt reports that voltaren has helped before. - Refilled Voltaren for R knee OA

## 2022-09-25 NOTE — Assessment & Plan Note (Signed)
Subjectively reports that his urinary symptoms seem to have improved since starting flomax.  - Cont Flomax 0.70m daily

## 2022-09-25 NOTE — Assessment & Plan Note (Signed)
BP elevated today. Did not take AM dose of amlodipine. Has hx of fall risk. Share decision making, pt agrees to stay at current dose for today and recheck in future. - Cont amlo 99m daily

## 2022-10-21 ENCOUNTER — Telehealth: Payer: Self-pay | Admitting: Family Medicine

## 2022-10-21 NOTE — Telephone Encounter (Signed)
Contacted Paul [name] to schedule their annual wellness visit. Appointment made for 11/07/2022.  Thank you,  [place] Direct dial  [phone]

## 2022-10-24 ENCOUNTER — Other Ambulatory Visit: Payer: Self-pay | Admitting: Student

## 2022-10-24 DIAGNOSIS — J302 Other seasonal allergic rhinitis: Secondary | ICD-10-CM

## 2022-11-06 NOTE — Progress Notes (Addendum)
I connected with  Paul [name] on 11/07/2022 by a audio enabled telemedicine application and verified that I am speaking with the correct person using two identifiers.  Patient Location: Home  Provider Location: Home Office  I discussed the limitations of evaluation and management by telemedicine. The patient expressed understanding and agreed to proceed.   Subjective:   Paul [name] is a 89 y.o. male who presents for Medicare Annual/Subsequent preventive examination.  Review of Systems    Per HPI unless specifically indicated below. Cardiac Risk Factors include: advanced age (>40men, >69 women);male gender, Hypertension, and Hyperlipidemia.           Objective:        09/23/2022    2:12 PM 09/23/2022    1:45 PM 07/18/2022    2:32 PM  Vitals with BMI  Height  5\' 5"    Weight  182 lbs 13 oz   BMI  [id number]   Systolic [id number]  Diastolic 85 [id number]  Pulse  88     There were no vitals filed for this visit. There is no height or weight on file to calculate BMI.     09/23/2022    1:46 PM 07/18/2022    1:46 PM 05/16/2022    1:46 PM 09/29/2021    1:39 PM 07/21/2021    9:56 AM 03/04/2021    7:03 PM 09/20/2018    7:20 PM  Advanced Directives  Does Patient Have a Medical Advance Directive? No No No No No No No  Does patient want to make changes to medical advance directive?       No - Patient declined  Would patient like information on creating a medical advance directive? No - Patient declined No - Patient declined No - Patient declined No - Patient declined No - Patient declined  No - Patient declined    Current Medications (verified) Outpatient Encounter Medications as of 11/07/2022  Medication Sig   amLODipine (NORVASC) 5 MG tablet TAKE 1 TABLET(5 MG) BY MOUTH DAILY   cetirizine (ZYRTEC) 5 MG tablet TAKE 1 TABLET(5 MG) BY MOUTH DAILY   diclofenac Sodium (VOLTAREN) 1 % GEL Apply 4 g topically 4 (four) times daily.   fluticasone (FLONASE) 50 MCG/ACT nasal spray SHAKE LIQUID AND  USE 2 SPRAYS IN EACH NOSTRIL DAILY   tamsulosin (FLOMAX) 0.4 MG CAPS capsule Take 1 capsule (0.4 mg total) by mouth daily.   No facility-administered encounter medications on file as of 11/07/2022.    Allergies (verified) Patient has no known allergies.   History: Past Medical History:  Diagnosis Date   History of Acute diastolic heart failure ([place]) 07/09/2013   Echo 2014: EF 60%  Left ventricular diastolic dysfunction Aortic valve sclerosis without stenosis     Hypercholesteremia    Hypertension    No past surgical history on file. 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  Tobacco Use   Smoking status: Never   Smokeless tobacco: Never  Vaping Use   Vaping Use: Never  used  Substance and Sexual Activity   Alcohol use: No   Drug use: No   Sexual activity: Never  Other Topics Concern   Not on file  Social History Narrative   Current Social History 07/05/2017           Patient lives with  in one level home 07/05/2017   Transportation: Patient has own vehicle  07/05/2017   Important Relationships *** 07/05/2017    Pets: *** 07/05/2017   Education / Work:  ***/ *** 07/05/2017   Interests / Fun: *** 07/05/2017   Current Stressors: *** 07/05/2017   Religious / Personal Beliefs: *** 07/05/2017   L. Silvano Rusk, RN, BSN                                                                                                 Social Determinants of Health   Financial Resource Strain: Not on file  Food Insecurity: No Food Insecurity (04/01/2021)   Hunger Vital Sign    Worried About Running Out of Food in the Last Year:  Never true    [place] in the Last Year: Never true  Transportation Needs: No Transportation Needs (04/01/2021)   PRAPARE - Hydrologist (Medical): No    Lack of Transportation (Non-Medical): No  Physical Activity: Not on file  Stress: No Stress Concern Present (04/01/2021)   [place]    Feeling of Stress : Only a little  Social Connections: Socially Isolated (04/01/2021)   Social Connection and Isolation Panel [NHANES]    Frequency of Communication with Friends and Family: More than three times a week    Frequency of Social Gatherings with Friends and Family: Once a week    Attends Religious Services: Never    Marine scientist or Organizations: No    Attends Archivist Meetings: Never    Marital Status: Widowed    Tobacco Counseling Counseling given: Not Answered   Clinical Intake:                 Diabetic?No         Activities of Daily Living     No data to display          Patient Care Team: Arlyce Dice, MD as PCP - General (Family Medicine) Drema Pry as Social Worker  Indicate any recent Medical Services you may have received from other than [facility] providers in the past year (date may be approximate).     Assessment:   This is a routine wellness examination for Paul [name].   Hearing/Vision screen Denies any hearing issues. Denies any change to her vision. Annual Eye Exam.   Dietary issues and exercise activities discussed:     Goals Addressed   None    Depression Screen    07/18/2022    1:47 PM 06/16/2022    2:04 PM 05/16/2022    1:45 PM 09/29/2021    1:39 PM 07/21/2021    9:57 AM 06/03/2021   11:35 AM 03/02/2021    2:06  PM  PHQ 2/9 Scores  PHQ - 2 Score 0 1 2 4 3 3 1   PHQ- 9 Score 0 12 7 15 19 12 13     Fall Risk    09/23/2022    1:46 PM 07/18/2022    1:46 PM 05/16/2022    1:45 PM 07/21/2021    9:56 AM 10/02/2018    10:41 AM  Fall Risk   Falls in the past year? 0 1 0 1 0  Number falls in past yr: 0 0 0 1   Injury with Fall? 0 0 0 1   Risk for fall due to :   Impaired balance/gait Impaired balance/gait;Impaired mobility   Follow up   Falls evaluation completed;Education provided;Falls prevention discussed      FALL RISK PREVENTION PERTAINING TO THE HOME:  Any stairs in or around the home? {YES/NO:21197} If so, are there any without handrails? {YES/NO:21197} Home free of loose throw rugs in walkways, pet beds, electrical cords, etc? {YES/NO:21197} Adequate lighting in your home to reduce risk of falls? {YES/NO:21197}  ASSISTIVE DEVICES UTILIZED TO PREVENT FALLS:  Life alert? {YES/NO:21197} Use of a cane, walker or w/c? {YES/NO:21197} Grab bars in the bathroom? {YES/NO:21197} Shower chair or bench in shower? {YES/NO:21197} Elevated toilet seat or a handicapped toilet? {YES/NO:21197}  TIMED UP AND GO:  Was the test performed? Unable to perform, virtual appointment    Cognitive Function:        Immunizations Immunization History  Administered Date(s) Administered   COVID-19, mRNA, vaccine(Comirnaty)12 years and older 06/20/2022   Fluad Quad(high Dose 65+) 06/03/2021, 05/16/2022   Influenza Split 05/08/2012   Influenza, High Dose Seasonal PF 06/08/2017, 06/15/2018   Influenza,inj,Quad PF,6+ Mos 06/13/2016   Influenza-Unspecified 06/19/2017, 05/16/2019   Moderna SARS-COV2 Booster Vaccination 12/24/2020   Pfizer Covid-19 Vaccine Bivalent Booster 40yrs & up 06/03/2021   Pneumococcal Conjugate-13 06/13/2016   Pneumococcal Polysaccharide-23 11/28/2008   Td 11/28/2008, 02/16/2010, 11/24/2012    TDAP status: Up to date  Flu Vaccine status: Up to date  Pneumococcal vaccine status: Up to date  Covid-19 vaccine status: Information provided on how to obtain vaccines.   Qualifies for Shingles Vaccine? Yes   Zostavax completed No   Shingrix Completed?: No.    Education has been provided  regarding the importance of this vaccine. Patient has been advised to call insurance company to determine out of pocket expense if they have not yet received this vaccine. Advised may also receive vaccine at local pharmacy or Health Dept. Verbalized acceptance and understanding.  Screening Tests Health Maintenance  Topic Date Due   Zoster Vaccines- Shingrix (1 of 2) Never done   Medicare Annual Wellness (AWV)  07/05/2018   DTaP/Tdap/Td (4 - Tdap) 11/25/2022   Pneumonia Vaccine 27+ Years old  Completed   INFLUENZA VACCINE  Completed   COVID-19 Vaccine  Completed   HPV VACCINES  Aged Out    Health Maintenance  Health Maintenance Due  Topic Date Due   Zoster Vaccines- Shingrix (1 of 2) Never done   Medicare Annual Wellness (AWV)  07/05/2018    Colorectal cancer screening: No longer required.   Lung Cancer Screening: (Low Dose CT Chest recommended if Age 52-80 years, 30 pack-year currently smoking OR have quit w/in 15years.) does not qualify.   Lung Cancer Screening Referral: not applicable   Additional Screening:  Hepatitis C Screening: does not qualify  Vision Screening: Recommended annual ophthalmology exams for early detection of glaucoma and other disorders of the eye. Is the patient  up to date with their annual eye exam?  Yes  Who is the provider or what is the name of the office in which the patient attends annual eye exams?  If pt is not established with a provider, would they like to be referred to a provider to establish care? No .   Dental Screening: Recommended annual dental exams for proper oral hygiene  Community Resource Referral / Chronic Care Management: CRR required this visit?  No   CCM required this visit?  No      Plan:     I have personally reviewed and noted the following in the patient's chart:   Medical and social history Use of alcohol, tobacco or illicit drugs  Current medications and supplements including opioid prescriptions. Patient is  not currently taking opioid prescriptions. Functional ability and status Nutritional status Physical activity Advanced directives List of other physicians Hospitalizations, surgeries, and ER visits in previous 12 months Vitals Screenings to include cognitive, depression, and falls Referrals and appointments  In addition, I have reviewed and discussed with patient certain preventive protocols, quality metrics, and best practice recommendations. A written personalized care plan for preventive services as well as general preventive health recommendations were provided to patient.     Wilson Singer, [place]   11/07/2022  Nurse Notes: Approximately 30 minute Non-Face -To-Face Medicare Wellness Visit

## 2022-11-07 ENCOUNTER — Ambulatory Visit (INDEPENDENT_AMBULATORY_CARE_PROVIDER_SITE_OTHER): Payer: 59

## 2022-11-07 DIAGNOSIS — Z0000 Encounter for general adult medical examination without abnormal findings: Secondary | ICD-10-CM

## 2022-11-14 ENCOUNTER — Ambulatory Visit: Payer: 59 | Admitting: Family Medicine

## 2023-01-11 ENCOUNTER — Other Ambulatory Visit: Payer: Self-pay | Admitting: *Deleted

## 2023-01-11 DIAGNOSIS — N401 Enlarged prostate with lower urinary tract symptoms: Secondary | ICD-10-CM

## 2023-01-11 MED ORDER — TAMSULOSIN HCL 0.4 MG PO CAPS: 0.4000 mg | ORAL_CAPSULE | Freq: Every day | ORAL | 3 refills | Status: DC

## 2023-01-26 ENCOUNTER — Encounter: Payer: Self-pay | Admitting: Family Medicine

## 2023-01-26 ENCOUNTER — Ambulatory Visit (INDEPENDENT_AMBULATORY_CARE_PROVIDER_SITE_OTHER): Payer: 59 | Admitting: Family Medicine

## 2023-01-26 VITALS — BP 145/71 | HR 74 | Ht 65.0 in | Wt 173.8 lb

## 2023-01-26 DIAGNOSIS — R531 Weakness: Secondary | ICD-10-CM | POA: Diagnosis not present

## 2023-01-26 DIAGNOSIS — I69398 Other sequelae of cerebral infarction: Secondary | ICD-10-CM

## 2023-01-26 HISTORY — DX: Other sequelae of cerebral infarction: I69.398
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
## 2023-01-26 NOTE — Patient Instructions (Signed)
Good to see you today - Thank you for coming in  Things we discussed today:  1) Let's get a CT scan of your brain. You will get a call in the next few days to schedule an appointment for a CT scan.  2) You will see our Geriatrics clinic in August 1st at 2:30pm.   3) I sent a referral for you to see a physical therapist. Expect a phone call from them in the next few days to schedule an appointment.   4) Stop taking Flomax. It could cause you to get dizzy.  Please always bring your medication bottles  Come back to see our Geriatric clinic in August 1st at 2:30pm.

## 2023-01-26 NOTE — Assessment & Plan Note (Addendum)
~  1mon hx of weakness (L>R), incontinence (unclear if urinary, stool ,or mixed), and confusion. He also had a syncope episode ~1 month ago. Neuro exam showed dysmetria w/ finger-to-nose, but otherwise no focal deficit. Strength grossly symmetric and intact. Differential includes age-related deconditioning, TIA, flomax side-effect, and electrolyte abnormalities. Age-related deconditioning is support by pt's age and hx of memory concerns. Plan for pt to f/u w/ Geri clinic and PT for further management of age-related processes. TIA is considered given acute nature (symptoms seemed to all have worsened 1 month ago) and pt subjectively feeling L sided weakness >R side. Will obtain head CT, but may need MRI in future if symptoms persist/worsen. Pt was also started on Flomax ~4 months ago for BPH, but this may be causing orthostasis so we will stop this for now. We will also obtain a BMP to workup potential electrolyte abnormalities.  - PT referral placed - F/u w/ Geriatric clinic - CT head w/o con ordered - Stop Flomax - f/u BMP

## 2023-01-27 LAB — BASIC METABOLIC PANEL
BUN/Creatinine Ratio: 10 (ref 10–24)
BUN: 14 mg/dL (ref 10–36)
CO2: 22 mmol/L (ref 20–29)
Calcium: 9.6 mg/dL (ref 8.6–10.2)
Chloride: 101 mmol/L (ref 96–106)
Creatinine, Ser: 1.36 mg/dL — ABNORMAL HIGH (ref 0.76–1.27)
Glucose: 99 mg/dL (ref 70–99)
Potassium: 4.5 mmol/L (ref 3.5–5.2)
Sodium: 140 mmol/L (ref 134–144)
eGFR: 49 mL/min/{1.73_m2} — ABNORMAL LOW (ref >59)

## 2023-02-15 ENCOUNTER — Ambulatory Visit (HOSPITAL_COMMUNITY)
Admission: RE | Admit: 2023-02-15 | Discharge: 2023-02-15 | Disposition: A | Discharge status: Home / Self Care | Payer: 59 | Source: Ambulatory Visit | Attending: Family Medicine | Admitting: Family Medicine

## 2023-02-15 ENCOUNTER — Ambulatory Visit: Payer: 59 | Attending: Family Medicine | Admitting: Physical Therapy

## 2023-02-15 ENCOUNTER — Telehealth: Payer: Self-pay | Admitting: Student

## 2023-02-15 DIAGNOSIS — R531 Weakness: Secondary | ICD-10-CM | POA: Diagnosis present

## 2023-02-15 NOTE — Telephone Encounter (Addendum)
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  There is no hemorrhage or midline shift.  There is mild mass effect on the left frontal horn of the ventricle.  I called patient and discussed these results.  I also called his grandson Duwayne Heck (not "son" as is listed in the chart) and relayed results.  Patient reports he is "feeling wonderful" and did not have any specific reason as to why he got the CT head done today versus on 6/13 when it was ordered.  He has not had any falls.  He is not having any word finding difficulty or trouble understanding what others are saying to him.  He is a bit hard of hearing.  He says he lives alone, and has been able to take care of himself.  He does endorse generalized weakness which has not been going ongoing for 1 month and he wants to know if there is any way we can help him with this.  I discussed that there is no urgent indication to come to the emergency room tonight or tomorrow unless he develops concerning neurological symptoms.  I discussed with grandson and the patient that there are multiple care options depending on how aggressive they want to be including obtaining MR angiogram, echocardiogram, carotid Dopplers, etc.  Discussed preventative secondary measures including managing blood pressure, hyperlipidemia if present, diabetes if present.  They will further discuss this at visit on Friday regarding goals of care.  I scheduled an office visit at [facility] on 7/5 at 4:00 PM.  Tomie China will speak with his wife and they will be present at the visit on Friday.  I called attending, Dr. Deirdre Priest and discussed all of the above who agrees with my plan.  Darral Dash, DO PGY-3 [facility]
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
## 2023-02-17 ENCOUNTER — Ambulatory Visit (INDEPENDENT_AMBULATORY_CARE_PROVIDER_SITE_OTHER): Payer: 59 | Admitting: Family Medicine

## 2023-02-17 ENCOUNTER — Other Ambulatory Visit: Payer: Self-pay

## 2023-02-17 VITALS — BP 137/75 | HR 82 | Ht 65.0 in | Wt 170.8 lb

## 2023-02-17 DIAGNOSIS — R531 Weakness: Secondary | ICD-10-CM

## 2023-02-17 DIAGNOSIS — I69398 Other sequelae of cerebral infarction: Secondary | ICD-10-CM

## 2023-02-17 DIAGNOSIS — Z7189 Other specified counseling: Secondary | ICD-10-CM | POA: Insufficient documentation

## 2023-02-17 DIAGNOSIS — R29818 Other symptoms and signs involving the nervous system: Secondary | ICD-10-CM

## 2023-02-17 DIAGNOSIS — I639 Cerebral infarction, unspecified: Secondary | ICD-10-CM | POA: Diagnosis not present

## 2023-02-17 MED ORDER — ROSUVASTATIN CALCIUM 20 MG PO TABS: 20.0000 mg | ORAL_TABLET | Freq: Every day | ORAL | 3 refills | Status: DC

## 2023-02-17 MED ORDER — ASPIRIN 81 MG PO TBEC: 81.0000 mg | DELAYED_RELEASE_TABLET | Freq: Every day | ORAL | 12 refills | Status: AC

## 2023-02-17 NOTE — Progress Notes (Signed)
    SUBJECTIVE:   CHIEF COMPLAINT / HPI:   Paul [name] is a 89yo M w/ hx of HTN that p/f f/u of acute ischemic stroke and L sided weakness. Had CT scan that confirmed acute to subacute infarct of L caudate and basal ganglia and chronic small vessel ischemic changes of white matter. Paul [name], was present at visit. Grandson reports that pt used to be more active, but now has been mainly sitting on couch all day and having much lower energy. Pt's L sided weakness is improving, but still present. Pt used to ride her motorcycle around but has been getting rides from grandson now.   - Benson Norway is grandson and HCPOA  OBJECTIVE:   BP 137/75   Pulse 82   Ht 5\' 5"  (1.651 m)   Wt 170 lb 12.8 oz (77.5 kg)   SpO2 98%   BMI 28.42 kg/m   Gen: Alert, pleasant older man. Speaking in full sentences and responding appropriately. NAD. HEENT: NCAT. MMM.  CV: RRR Resp: CTAB. Normal WOB on RA Gait: Shuffling, unsteady gait.  ASSESSMENT/PLAN:   Neurologic deficit due to old ischemic stroke Had CT head for L sided weakness, which revealed acute to subacute infarct of L caudate and basal ganglia and chronic small vessel ischemic changes of white matter. Pt is still having low energy, staying home mostly. L sided weakness improving but gait still unsteady, pt remains high fall risk. BP at goal today. - Start Rosuvastatin 20mg  daily for secondary prevention - Start ASA 81 daily for secondary prevention - cont amlodipine 5mg  daily - Referral to Neurology to discuss further workup options. Given pt age, he may not be a good surgical candidate. Appreciate Neurology input on whether or not further imaging would be helpful for him. - Given increased fall risk and residual L sided weakness, pt would benefit from [facility] PT. Order placed. - Discussed stroke signs and symptoms for grandson to look out for and advised to call 911 if c/f stroke  Goals of care, counseling/discussion Given recent stroke and pt age, discussed GOC  with pt. Paul [name], is also HCPOA and present at visit. Provided pt with copy of advanced directives to look over and discuss with grandson.   Lincoln Brigham, MD [facility]

## 2023-02-17 NOTE — Assessment & Plan Note (Signed)
Given recent stroke and pt age, discussed GOC with pt. Paul [name], is also HCPOA and present at visit. Provided pt with copy of advanced directives to look over and discuss with grandson.

## 2023-02-17 NOTE — Patient Instructions (Signed)
Good to see you today - Thank you for coming in  Things we discussed today:  1) You had a stroke that led to weakness on your Left side of your body.  - I am sending you to see a brain doctor Dealer). Expect a phone call from them in the next few days to schedule an appointment. - Start taking Rosuvastatin once a day - Start taking Aspirin once a day - I am sending an order for you to get Physical Therapy. Expect a phone call from them in the next few days to schedule an appointment.  Please always bring your medication bottles  Come back to see me in 1 month

## 2023-02-17 NOTE — Assessment & Plan Note (Addendum)
Had CT head for L sided weakness, which revealed acute to subacute infarct of L caudate and basal ganglia and chronic small vessel ischemic changes of white matter. Pt is still having low energy, staying home mostly. L sided weakness improving but gait still unsteady, pt remains high fall risk. BP at goal today. - Start Rosuvastatin 20mg  daily for secondary prevention - Start ASA 81 daily for secondary prevention - cont amlodipine 5mg  daily - Referral to Neurology to discuss further workup options. Given pt age, he may not be a good surgical candidate. Appreciate Neurology input on whether or not further imaging would be helpful for him. - Given increased fall risk and residual L sided weakness, pt would benefit from [facility] PT. Order placed. - Discussed stroke signs and symptoms for grandson to look out for and advised to call 911 if c/f stroke

## 2023-03-16 ENCOUNTER — Ambulatory Visit: Payer: 59

## 2023-03-16 DIAGNOSIS — N1831 Chronic kidney disease, stage 3a: Secondary | ICD-10-CM | POA: Insufficient documentation

## 2023-03-16 NOTE — Progress Notes (Deleted)
    SUBJECTIVE:   CHIEF COMPLAINT / HPI: Weakness, urinary incontinence  Paul [name] is a 89 year old male presenting to [facility] clinic at the referral of Dr. Lincoln Brigham.  Per chart review initially seen on 06/13 with weakness and acute on chronic urinary incontinence thought to be due to age-related process at that time.  CT head was ordered to rule out TIA/stroke.  CT demonstrated subacute infarct of left caudate and basal ganglia.  Started on secondary prevention, neurology referral placed.  Of note patient's grandson Benson Norway is HCPOA, was provided with advanced directive at his last visit with Dr. Sherrilee Gilles.  Today,  PERTINENT  PMH / PSH: HTN, GERD, Paget's disease, CKD, impaired memory, history of CVA  OBJECTIVE:   There were no vitals taken for this visit.  ***  ASSESSMENT/PLAN:   CKD stage 3a, GFR 45-59 ml/min (HCC)  Cerebrovascular accident (CVA) due to other mechanism [facility])  Pure hypercholesterolemia   No follow-ups on file.  Celine Mans, MD [facility]
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
## 2023-03-24 ENCOUNTER — Encounter: Payer: Self-pay | Admitting: Neurology

## 2023-03-24 ENCOUNTER — Ambulatory Visit (INDEPENDENT_AMBULATORY_CARE_PROVIDER_SITE_OTHER): Payer: 59 | Admitting: Neurology

## 2023-03-24 ENCOUNTER — Other Ambulatory Visit (INDEPENDENT_AMBULATORY_CARE_PROVIDER_SITE_OTHER): Payer: 59

## 2023-03-24 VITALS — BP 142/83 | HR 85 | Ht 64.0 in | Wt 164.0 lb

## 2023-03-24 DIAGNOSIS — Z131 Encounter for screening for diabetes mellitus: Secondary | ICD-10-CM

## 2023-03-24 DIAGNOSIS — R269 Unspecified abnormalities of gait and mobility: Secondary | ICD-10-CM

## 2023-03-24 DIAGNOSIS — I639 Cerebral infarction, unspecified: Secondary | ICD-10-CM

## 2023-03-24 DIAGNOSIS — R2689 Other abnormalities of gait and mobility: Secondary | ICD-10-CM

## 2023-03-24 LAB — LIPID PANEL
Cholesterol: 137 mg/dL (ref 0–200)
HDL: 38 mg/dL — ABNORMAL LOW (ref >39.00)
LDL Cholesterol: 81 mg/dL (ref 0–99)
NonHDL: 98.9
Total CHOL/HDL Ratio: 4
Triglycerides: 92 mg/dL (ref 0.0–149.0)
VLDL: 18.4 mg/dL (ref 0.0–40.0)

## 2023-03-24 LAB — TSH: TSH: 1.68 u[IU]/mL (ref 0.35–5.50)

## 2023-03-24 LAB — HEMOGLOBIN A1C: Hgb A1c MFr Bld: 5.3 % (ref 4.6–6.5)

## 2023-03-24 NOTE — Patient Instructions (Addendum)
I saw you today for a stroke seen on your recent CT scan. Based on the location, the likely cause is high blood pressure. I would like to get blood work to make sure there are not other risk factors that increase your risk of stroke. I will be in touch when I have your results.  You should be taking 3 medications each day. Please make sure you are taking: -Norvasc (or Amlodipine) 5 mg every day -Crestor (or Rosuvastatin) 20 mg every day -Aspirin 81 mg every day  If you do not have any of those medications, please let me know. They will help reduce the risk of future strokes.  I am putting in a referral for physical therapy to help with your walking and weakness. You will get a call about scheduling this. If you do not hear from anyone in 1-2 weeks, please let me know.  If you have new difficulty speaking, face droop, numbness on one side of the body, weakness on one side of the body, or dizziness/imbalance, this could be the sign of a stroke. Don't wait, please call EMS and be evaluated at the nearest emergency room.  The physicians and staff at [facility] are committed to providing excellent care. You may receive a survey requesting feedback about your experience at our office. We strive to receive "very good" responses to the survey questions. If you feel that your experience would prevent you from giving the office a "very good " response, please contact our office to try to remedy the situation. We may be reached at [phone]. Thank you for taking the time out of your busy day to complete the survey.  Jacquelyne Balint, MD [facility]  Preventing Falls at [facility] are common, often dreaded events in the lives of older people. Aside from the obvious injuries and even death that may result, fall can cause wide-ranging consequences including loss of independence, mental decline, decreased activity and mobility. Younger people are also at risk of falling, especially those with  chronic illnesses and fatigue.  Ways to reduce risk for falling Examine diet and medications. Warm foods and alcohol dilate blood vessels, which can lead to dizziness when standing. Sleep aids, antidepressants and pain medications can also increase the likelihood of a fall.  Get a vision exam. Poor vision, cataracts and glaucoma increase the chances of falling.  Check foot gear. Shoes should fit snugly and have a sturdy, nonskid sole and a broad, low heel  Participate in a physician-approved exercise program to build and maintain muscle strength and improve balance and coordination. Programs that use ankle weights or stretch bands are excellent for muscle-strengthening. Water aerobics programs and low-impact Tai Chi programs have also been shown to improve balance and coordination.  Increase vitamin D intake. Vitamin D improves muscle strength and increases the amount of calcium the body is able to absorb and deposit in bones.  How to prevent falls from common hazards Floors - Remove all loose wires, cords, and throw rugs. Minimize clutter. Make sure rugs are anchored and smooth. Keep furniture in its usual place.  Chairs -- Use chairs with straight backs, armrests and firm seats. Add firm cushions to existing pieces to add height.  Bathroom - Install grab bars and non-skid tape in the tub or shower. Use a bathtub transfer bench or a shower chair with a back support Use an elevated toilet seat and/or safety rails to assist standing from a low surface. Do not use towel racks or bathroom  tissue holders to help you stand.  Lighting - Make sure halls, stairways, and entrances are well-lit. Install a night light in your bathroom or hallway. Make sure there is a light switch at the top and bottom of the staircase. Turn lights on if you get up in the middle of the night. Make sure lamps or light switches are within reach of the bed if you have to get up during the night.  Kitchen - Install non-skid  rubber mats near the sink and stove. Clean spills immediately. Store frequently used utensils, pots, pans between waist and eye level. This helps prevent reaching and bending. Sit when getting things out of lower cupboards.  Living room/ Bedrooms - Place furniture with wide spaces in between, giving enough room to move around. Establish a route through the living room that gives you something to hold onto as you walk.  Stairs - Make sure treads, rails, and rugs are secure. Install a rail on both sides of the stairs. If stairs are a threat, it might be helpful to arrange most of your activities on the lower level to reduce the number of times you must climb the stairs.  Entrances and doorways - Install metal handles on the walls adjacent to the doorknobs of all doors to make it more secure as you travel through the doorway.  Tips for maintaining balance Keep at least one hand free at all times. Try using a backpack or fanny pack to hold things rather than carrying them in your hands. Never carry objects in both hands when walking as this interferes with keeping your balance.  Attempt to swing both arms from front to back while walking. This might require a conscious effort if Parkinson's disease has diminished your movement. It will, however, help you to maintain balance and posture, and reduce fatigue.  Consciously lift your feet off of the ground when walking. Shuffling and dragging of the feet is a common culprit in losing your balance.  When trying to navigate turns, use a "U" technique of facing forward and making a wide turn, rather than pivoting sharply.  Try to stand with your feet shoulder-length apart. When your feet are close together for any length of time, you increase your risk of losing your balance and falling.  Do one thing at a time. Don't try to walk and accomplish another task, such as reading or looking around. The decrease in your automatic reflexes complicates motor function,  so the less distraction, the better.  Do not wear rubber or gripping soled shoes, they might "catch" on the floor and cause tripping.  Move slowly when changing positions. Use deliberate, concentrated movements and, if needed, use a grab bar or walking aid. Count 15 seconds between each movement. For example, when rising from a seated position, wait 15 seconds after standing to begin walking.  If balance is a continuous problem, you might want to consider a walking aid such as a cane, walking stick, or walker. Once you've mastered walking with help, you might be ready to try it on your own again.

## 2023-03-24 NOTE — Progress Notes (Deleted)
Reviewed and agree with Dr Koval's plan.   

## 2023-03-27 ENCOUNTER — Telehealth: Payer: Self-pay | Admitting: Neurology

## 2023-03-27 NOTE — Telephone Encounter (Signed)
Called grandson and discussed the results of testing. HbA1c and TSH were normal. Lipid panel showed an LDL of 81, which would be above the < 70 goal after stroke. Given the confusion about medications and whether patient had been taking asa 81 mg and crestor 20 mg daily, grandson will be checking on this and confirming with patient that he is taking all of his medications daily.  The plan will be to recheck the lipid panel at next follow up in 6 months.  All questions were answered.  Jacquelyne Balint, MD [facility]
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
## 2023-10-04 ENCOUNTER — Telehealth: Payer: 59 | Admitting: Neurology

## 2023-10-04 ENCOUNTER — Encounter: Payer: Self-pay | Admitting: Neurology

## 2023-10-04 VITALS — Ht 66.0 in | Wt 160.0 lb

## 2023-10-04 DIAGNOSIS — E785 Hyperlipidemia, unspecified: Secondary | ICD-10-CM

## 2023-10-04 DIAGNOSIS — R2689 Other abnormalities of gait and mobility: Secondary | ICD-10-CM

## 2023-10-04 DIAGNOSIS — R269 Unspecified abnormalities of gait and mobility: Secondary | ICD-10-CM

## 2023-10-04 DIAGNOSIS — I639 Cerebral infarction, unspecified: Secondary | ICD-10-CM | POA: Diagnosis not present

## 2023-10-04 NOTE — Patient Instructions (Signed)
Continue aspirin 81 mg daily  Continue Crestor 20 mg daily  If you have new difficulty speaking, face droop, numbness on one side of the body, weakness on one side of the body, or dizziness/imbalance, this could be the sign of a stroke. Don't wait, please call EMS and be evaluated at the nearest emergency room.  Follow up with me as needed.  The physicians and staff at [facility] are committed to providing excellent care. You may receive a survey requesting feedback about your experience at our office. We strive to receive "very good" responses to the survey questions. If you feel that your experience would prevent you from giving the office a "very good " response, please contact our office to try to remedy the situation. We may be reached at [phone]. Thank you for taking the time out of your busy day to complete the survey.  Jacquelyne Balint, MD [facility]  Preventing Falls at [facility] are common, often dreaded events in the lives of older people. Aside from the obvious injuries and even death that may result, fall can cause wide-ranging consequences including loss of independence, mental decline, decreased activity and mobility. Younger people are also at risk of falling, especially those with chronic illnesses and fatigue.  Ways to reduce risk for falling Examine diet and medications. Warm foods and alcohol dilate blood vessels, which can lead to dizziness when standing. Sleep aids, antidepressants and pain medications can also increase the likelihood of a fall.  Get a vision exam. Poor vision, cataracts and glaucoma increase the chances of falling.  Check foot gear. Shoes should fit snugly and have a sturdy, nonskid sole and a broad, low heel  Participate in a physician-approved exercise program to build and maintain muscle strength and improve balance and coordination. Programs that use ankle weights or stretch bands are excellent for muscle-strengthening. Water  aerobics programs and low-impact Tai Chi programs have also been shown to improve balance and coordination.  Increase vitamin D intake. Vitamin D improves muscle strength and increases the amount of calcium the body is able to absorb and deposit in bones.  How to prevent falls from common hazards Floors - Remove all loose wires, cords, and throw rugs. Minimize clutter. Make sure rugs are anchored and smooth. Keep furniture in its usual place.  Chairs -- Use chairs with straight backs, armrests and firm seats. Add firm cushions to existing pieces to add height.  Bathroom - Install grab bars and non-skid tape in the tub or shower. Use a bathtub transfer bench or a shower chair with a back support Use an elevated toilet seat and/or safety rails to assist standing from a low surface. Do not use towel racks or bathroom tissue holders to help you stand.  Lighting - Make sure halls, stairways, and entrances are well-lit. Install a night light in your bathroom or hallway. Make sure there is a light switch at the top and bottom of the staircase. Turn lights on if you get up in the middle of the night. Make sure lamps or light switches are within reach of the bed if you have to get up during the night.  Kitchen - Install non-skid rubber mats near the sink and stove. Clean spills immediately. Store frequently used utensils, pots, pans between waist and eye level. This helps prevent reaching and bending. Sit when getting things out of lower cupboards.  Living room/ Bedrooms - Place furniture with wide spaces in between, giving enough room to move around. Establish  a route through the living room that gives you something to hold onto as you walk.  Stairs - Make sure treads, rails, and rugs are secure. Install a rail on both sides of the stairs. If stairs are a threat, it might be helpful to arrange most of your activities on the lower level to reduce the number of times you must climb the stairs.  Entrances  and doorways - Install metal handles on the walls adjacent to the doorknobs of all doors to make it more secure as you travel through the doorway.  Tips for maintaining balance Keep at least one hand free at all times. Try using a backpack or fanny pack to hold things rather than carrying them in your hands. Never carry objects in both hands when walking as this interferes with keeping your balance.  Attempt to swing both arms from front to back while walking. This might require a conscious effort if Parkinson's disease has diminished your movement. It will, however, help you to maintain balance and posture, and reduce fatigue.  Consciously lift your feet off of the ground when walking. Shuffling and dragging of the feet is a common culprit in losing your balance.  When trying to navigate turns, use a "U" technique of facing forward and making a wide turn, rather than pivoting sharply.  Try to stand with your feet shoulder-length apart. When your feet are close together for any length of time, you increase your risk of losing your balance and falling.  Do one thing at a time. Don't try to walk and accomplish another task, such as reading or looking around. The decrease in your automatic reflexes complicates motor function, so the less distraction, the better.  Do not wear rubber or gripping soled shoes, they might "catch" on the floor and cause tripping.  Move slowly when changing positions. Use deliberate, concentrated movements and, if needed, use a grab bar or walking aid. Count 15 seconds between each movement. For example, when rising from a seated position, wait 15 seconds after standing to begin walking.  If balance is a continuous problem, you might want to consider a walking aid such as a cane, walking stick, or walker. Once you've mastered walking with help, you might be ready to try it on your own again.

## 2024-03-21 ENCOUNTER — Other Ambulatory Visit: Payer: Self-pay

## 2024-03-21 DIAGNOSIS — I639 Cerebral infarction, unspecified: Secondary | ICD-10-CM

## 2024-03-21 DIAGNOSIS — R531 Weakness: Secondary | ICD-10-CM

## 2024-03-21 DIAGNOSIS — R29818 Other symptoms and signs involving the nervous system: Secondary | ICD-10-CM

## 2024-03-21 MED ORDER — ROSUVASTATIN CALCIUM 20 MG PO TABS
20.0000 mg | ORAL_TABLET | Freq: Every day | ORAL | 3 refills | Status: AC
Start: 2024-03-21 — End: ?
